# Patient Record
Sex: Female | Born: 1975 | Race: White | Hispanic: No | Marital: Single | Smoking: Current every day smoker
Health system: Southern US, Community
[De-identification: ages and names within clinical notes are randomized; demographics above are authoritative.]

## PROBLEM LIST (undated history)

## (undated) DIAGNOSIS — E785 Hyperlipidemia, unspecified: Secondary | ICD-10-CM

## (undated) DIAGNOSIS — J45909 Unspecified asthma, uncomplicated: Secondary | ICD-10-CM

## (undated) DIAGNOSIS — F410 Panic disorder [episodic paroxysmal anxiety] without agoraphobia: Secondary | ICD-10-CM

## (undated) DIAGNOSIS — F4310 Post-traumatic stress disorder, unspecified: Secondary | ICD-10-CM

## (undated) HISTORY — PX: TUBAL LIGATION: SHX77

## (undated) HISTORY — PX: KNEE SURGERY: SHX244

## (undated) HISTORY — PX: NECK SURGERY: SHX720

---

## 2008-01-16 DIAGNOSIS — F4310 Post-traumatic stress disorder, unspecified: Secondary | ICD-10-CM

## 2008-01-16 HISTORY — DX: Post-traumatic stress disorder, unspecified: F43.10

## 2013-01-15 DIAGNOSIS — E785 Hyperlipidemia, unspecified: Secondary | ICD-10-CM

## 2013-01-15 HISTORY — DX: Hyperlipidemia, unspecified: E78.5

## 2018-02-05 ENCOUNTER — Encounter (HOSPITAL_COMMUNITY): Payer: Self-pay | Admitting: *Deleted

## 2018-02-05 ENCOUNTER — Other Ambulatory Visit: Payer: Self-pay

## 2018-02-05 ENCOUNTER — Emergency Department (HOSPITAL_COMMUNITY): Payer: Self-pay

## 2018-02-05 ENCOUNTER — Emergency Department (HOSPITAL_COMMUNITY)
Admission: EM | Admit: 2018-02-05 | Discharge: 2018-02-06 | Disposition: A | Discharge status: Home / Self Care | Payer: Self-pay | Attending: Emergency Medicine | Admitting: Emergency Medicine

## 2018-02-05 DIAGNOSIS — F17210 Nicotine dependence, cigarettes, uncomplicated: Secondary | ICD-10-CM | POA: Insufficient documentation

## 2018-02-05 DIAGNOSIS — J4520 Mild intermittent asthma, uncomplicated: Secondary | ICD-10-CM | POA: Insufficient documentation

## 2018-02-05 DIAGNOSIS — R0789 Other chest pain: Secondary | ICD-10-CM

## 2018-02-05 HISTORY — DX: Unspecified asthma, uncomplicated: J45.909

## 2018-02-05 LAB — CBC
HCT: 39.8 % (ref 36.0–46.0)
Hemoglobin: 12.4 g/dL (ref 12.0–15.0)
MCH: 26.6 pg (ref 26.0–34.0)
MCHC: 31.2 g/dL (ref 30.0–36.0)
MCV: 85.2 fL (ref 80.0–100.0)
PLATELETS: 445 10*3/uL — AB (ref 150–400)
RBC: 4.67 MIL/uL (ref 3.87–5.11)
RDW: 13.9 % (ref 11.5–15.5)
WBC: 11.4 10*3/uL — ABNORMAL HIGH (ref 4.0–10.5)
nRBC: 0 % (ref 0.0–0.2)

## 2018-02-05 LAB — BASIC METABOLIC PANEL
Anion gap: 12 (ref 5–15)
BUN: 8 mg/dL (ref 6–20)
CALCIUM: 8.8 mg/dL — AB (ref 8.9–10.3)
CO2: 23 mmol/L (ref 22–32)
Chloride: 103 mmol/L (ref 98–111)
Creatinine, Ser: 0.84 mg/dL (ref 0.44–1.00)
GFR calc non Af Amer: >60 mL/min (ref >60)
Glucose, Bld: 101 mg/dL — ABNORMAL HIGH (ref 70–99)
Potassium: 3.7 mmol/L (ref 3.5–5.1)
Sodium: 138 mmol/L (ref 135–145)

## 2018-02-05 LAB — I-STAT BETA HCG BLOOD, ED (MC, WL, AP ONLY)

## 2018-02-05 LAB — TROPONIN I: Troponin I: <0.03 ng/mL (ref <0.03)

## 2018-02-05 MED ORDER — SODIUM CHLORIDE 0.9% FLUSH: 3.0000 mL | Freq: Once | INTRAVENOUS | Status: DC

## 2018-02-05 MED ORDER — ALBUTEROL SULFATE HFA 108 (90 BASE) MCG/ACT IN AERS
2.0000 | INHALATION_SPRAY | Freq: Once | RESPIRATORY_TRACT | Status: AC
  Administered 2018-02-05: 2 via RESPIRATORY_TRACT
  Filled 2018-02-05: qty 6.7

## 2018-02-05 MED ORDER — ALBUTEROL SULFATE (2.5 MG/3ML) 0.083% IN NEBU
5.0000 mg | INHALATION_SOLUTION | Freq: Once | RESPIRATORY_TRACT | Status: AC
  Administered 2018-02-05: 5 mg via RESPIRATORY_TRACT
  Filled 2018-02-05: qty 6

## 2018-02-05 NOTE — ED Notes (Signed)
Pt discharged from ED; instructions provided and scripts given; Pt encouraged to return to ED if symptoms worsen and to f/u with PCP; Pt verbalized understanding of all instructions 

## 2018-02-05 NOTE — ED Triage Notes (Signed)
Pt reports she was at rest and had onset of left sided chest "burning" that goes up into her throat. She has asthma, +cough, last used inhaler this afternoon. Feels nauseated.

## 2018-02-05 NOTE — ED Provider Notes (Signed)
[facility] EMERGENCY DEPARTMENT Provider Note  CSN: [id number] Arrival date & time: 02/05/18 1939  Chief Complaint(s) Chest Pain  HPI Tammy Zamora is a 43 y.o. female   The history is provided by the patient.  Chest Pain  Chest pain location: sternal. Pain quality: sharp and tightness   Pain radiates to:  Does not radiate Pain severity:  Moderate Onset quality:  Gradual Duration:  4 hours Timing:  Constant Progression:  Waxing and waning Chronicity:  Recurrent Context: movement   Context: not trauma   Context comment:  Asthma attack; similar to prior Relieved by: breathing treatments. Worsened by:  Coughing Associated symptoms: cough (dry), nausea and shortness of breath   Associated symptoms: no fever and no vomiting   Risk factors: smoking     Past Medical History Past Medical History:  Diagnosis Date  . Asthma    There are no active problems to display for this patient.  Home Medication(s) Prior to Admission medications   Not on File                                                                                                                                    Past Surgical History Past Surgical History:  Procedure Laterality Date  . NECK SURGERY     Family History No family history on file.  Social History Social History   Tobacco Use  . Smoking status: Current Every Day Smoker  . Smokeless tobacco: Never Used  Substance Use Topics  . Alcohol use: Never    Frequency: Never  . Drug use: Never   Allergies Morphine and related  Review of Systems Review of Systems  Constitutional: Negative for fever.  Respiratory: Positive for cough (dry) and shortness of breath.   Cardiovascular: Positive for chest pain.  Gastrointestinal: Positive for nausea. Negative for vomiting.   All other systems are reviewed and are negative for acute change except as noted in the HPI  Physical Exam Vital Signs  I have reviewed the triage  vital signs BP 136/80   Pulse 86   Temp (!) 97.4 F (36.3 C) (Oral)   Resp (!) 23   LMP 01/29/2018   SpO2 98%   Physical Exam Vitals signs reviewed.  Constitutional:      General: She is not in acute distress.    Appearance: She is well-developed. She is not diaphoretic.  HENT:     Head: Normocephalic and atraumatic.     Comments: edentulous    Nose: Nose normal.  Eyes:     General: No scleral icterus.       Right eye: No discharge.        Left eye: No discharge.     Conjunctiva/sclera: Conjunctivae normal.     Pupils: Pupils are equal, round, and reactive to light.  Neck:     Musculoskeletal: Normal range of motion and neck supple.  Cardiovascular:  Rate and Rhythm: Normal rate and regular rhythm.     Heart sounds: No murmur. No friction rub. No gallop.   Pulmonary:     Effort: Pulmonary effort is normal. No respiratory distress.     Breath sounds: Normal breath sounds. No stridor. No wheezing, rhonchi or rales.  Chest:    Abdominal:     General: There is no distension.     Palpations: Abdomen is soft.     Tenderness: There is no abdominal tenderness.  Musculoskeletal:        General: No tenderness.  Skin:    General: Skin is warm and dry.     Findings: No erythema or rash.  Neurological:     Mental Status: She is alert and oriented to person, place, and time.     ED Results and Treatments Labs (all labs ordered are listed, but only abnormal results are displayed) Labs Reviewed  BASIC METABOLIC PANEL - Abnormal; Notable for the following components:      Result Value   Glucose, Bld 101 (*)    Calcium 8.8 (*)    All other components within normal limits  CBC - Abnormal; Notable for the following components:   WBC 11.4 (*)    Platelets 445 (*)    All other components within normal limits  TROPONIN I  I-STAT BETA HCG BLOOD, ED ([facility], [facility], [facility] ONLY)                                                                                                                          EKG  EKG Interpretation  Date/Time:  Wednesday February 05 2018 19:47:24 EST Ventricular Rate:  78 PR Interval:  124 QRS Duration: 86 QT Interval:  390 QTC Calculation: 444 R Axis:   42 Text Interpretation:  Normal sinus rhythm Abnormal ECG NO STEMI No old tracing to compare Confirmed by Drema Pryardama, Ger Ringenberg [phone]([id number]) on 02/05/2018 11:32:36 PM      Radiology Dg Chest 2 View  Result Date: 02/05/2018 CLINICAL DATA:  Chest pain EXAM: CHEST - 2 VIEW COMPARISON:  None. FINDINGS: The heart size and mediastinal contours are within normal limits. Both lungs are clear. Surgical changes in the lower cervical spine. Mild degenerative changes of the spine. IMPRESSION: No active cardiopulmonary disease. Electronically Signed   By: Jasmine PangKim  Fujinaga M.D.   On: 02/05/2018 20:49   Pertinent labs & imaging results that were available during my care of the patient were reviewed by me and considered in my medical decision making (see chart for details).  Medications Ordered in ED Medications  sodium chloride flush (NS) 0.9 % injection 3 mL (has no administration in time range)  albuterol (PROVENTIL) (2.5 MG/3ML) 0.083% nebulizer solution 5 mg (5 mg Nebulization Given 02/05/18 2002)  albuterol (PROVENTIL HFA;VENTOLIN HFA) 108 (90 Base) MCG/ACT inhaler 2 puff (2 puffs Inhalation Given 02/05/18 2339)  Procedures Procedures  (including critical care time)  Medical Decision Making / ED Course I have reviewed the nursing notes for this encounter and the patient's prior records (if available in EHR or on provided paperwork).    Patient presents with sternal chest pain most consistent with chest wall pain.  Highly inconsistent with ACS.  EKG without acute ischemic changes or evidence of pericarditis.  Initial troponin negative.  Given the low suspicion for cardiac etiology, do not  feel that additional troponin is necessary at this time.  Low pretest probability for pulmonary embolism.  Presentation not classic for aortic dissection or esophageal perforation.  Chest x-ray without evidence suggestive of pneumonia, pneumothorax, pneumomediastinum.  No abnormal contour of the mediastinum to suggest dissection. No evidence of acute injuries.  The patient appears reasonably screened and/or stabilized for discharge and I doubt any other medical condition or other [facility] requiring further screening, evaluation, or treatment in the ED at this time prior to discharge.  The patient is safe for discharge with strict return precautions.   Final Clinical Impression(s) / ED Diagnoses Final diagnoses:  Chest wall pain  Mild intermittent asthma without complication   Disposition: Discharge  Condition: Good  I have discussed the results, Dx and Tx plan with the patient who expressed understanding and agree(s) with the plan. Discharge instructions discussed at great length. The patient was given strict return precautions who verbalized understanding of the instructions. No further questions at time of discharge.    ED Discharge Orders    None       Follow Up: Primary care provider  Schedule an appointment as soon as possible for a visit  If you do not have a primary care physician, contact [facility] at [phone] for referral      This chart was dictated using voice recognition software.  Despite best efforts to proofread,  errors can occur which can change the documentation meaning.   Nira Connardama, Coulter Oldaker Eduardo, MD 02/06/18 [phone]

## 2018-03-27 ENCOUNTER — Emergency Department (HOSPITAL_COMMUNITY)
Admission: EM | Admit: 2018-03-27 | Discharge: 2018-03-27 | Disposition: A | Discharge status: Home / Self Care | Payer: Self-pay | Attending: Emergency Medicine | Admitting: Emergency Medicine

## 2018-03-27 ENCOUNTER — Other Ambulatory Visit: Payer: Self-pay

## 2018-03-27 ENCOUNTER — Encounter (HOSPITAL_COMMUNITY): Payer: Self-pay | Admitting: Emergency Medicine

## 2018-03-27 DIAGNOSIS — S80251A Superficial foreign body, right knee, initial encounter: Secondary | ICD-10-CM | POA: Insufficient documentation

## 2018-03-27 DIAGNOSIS — J45909 Unspecified asthma, uncomplicated: Secondary | ICD-10-CM | POA: Insufficient documentation

## 2018-03-27 DIAGNOSIS — Y999 Unspecified external cause status: Secondary | ICD-10-CM | POA: Insufficient documentation

## 2018-03-27 DIAGNOSIS — Y929 Unspecified place or not applicable: Secondary | ICD-10-CM | POA: Insufficient documentation

## 2018-03-27 DIAGNOSIS — Y939 Activity, unspecified: Secondary | ICD-10-CM | POA: Insufficient documentation

## 2018-03-27 DIAGNOSIS — T148XXA Other injury of unspecified body region, initial encounter: Secondary | ICD-10-CM

## 2018-03-27 DIAGNOSIS — W57XXXA Bitten or stung by nonvenomous insect and other nonvenomous arthropods, initial encounter: Secondary | ICD-10-CM | POA: Insufficient documentation

## 2018-03-27 DIAGNOSIS — F17210 Nicotine dependence, cigarettes, uncomplicated: Secondary | ICD-10-CM | POA: Insufficient documentation

## 2018-03-27 HISTORY — DX: Post-traumatic stress disorder, unspecified: F43.10

## 2018-03-27 HISTORY — DX: Hyperlipidemia, unspecified: E78.5

## 2018-03-27 MED ORDER — SULFAMETHOXAZOLE-TRIMETHOPRIM 800-160 MG PO TABS: 1.0000 | ORAL_TABLET | Freq: Two times a day (BID) | ORAL | 0 refills | Status: AC

## 2018-03-27 MED ORDER — POVIDONE-IODINE 10 % EX SOLN
CUTANEOUS | Status: AC
  Administered 2018-03-27: 2
  Filled 2018-03-27: qty 30

## 2018-03-27 MED ORDER — LIDOCAINE-EPINEPHRINE (PF) 1 %-1:200000 IJ SOLN
10.0000 mL | Freq: Once | INTRAMUSCULAR | Status: AC
  Administered 2018-03-27: 10 mL
  Filled 2018-03-27: qty 30

## 2018-03-27 NOTE — ED Provider Notes (Addendum)
[facility] EMERGENCY DEPARTMENT Provider Note   CSN: [id number] Arrival date & time: 03/27/18  4696    History   Chief Complaint Chief Complaint  Patient presents with  . Insect Bite    HPI Tammy Zamora is a 43 y.o. female.     Patient is a 43 year old female who presents to the emergency department with complaint of tick bite  Patient states that she noted a tick embedded in the back of her right knee this morning.  She attempted to remove the tick with tweezers, but a portion of the tick remained embedded in the skin.  She and family member attempted to remove it with a knife but were unsuccessful.  They noticed increasing redness as the days progressed and thought they should come to the emergency department for evaluation.  There is been no fever or chills reported.  No nausea or vomiting noted.  No red streaks according to the patient.  The history is provided by the patient.    Past Medical History:  Diagnosis Date  . Asthma   . Hyperlipemia 2015  . PTSD (post-traumatic stress disorder) 2010    There are no active problems to display for this patient.   Past Surgical History:  Procedure Laterality Date  . NECK SURGERY       OB History   No obstetric history on file.      Home Medications    Prior to Admission medications   Not on File    Family History History reviewed. No pertinent family history.  Social History Social History   Tobacco Use  . Smoking status: Current Every Day Smoker  . Smokeless tobacco: Never Used  Substance Use Topics  . Alcohol use: Never    Frequency: Never  . Drug use: Never     Allergies   Morphine and related   Review of Systems Review of Systems  Constitutional: Negative for activity change.       All ROS Neg except as noted in HPI  HENT: Negative for nosebleeds.   Eyes: Negative for photophobia and discharge.  Respiratory: Negative for cough, shortness of breath and wheezing.   Cardiovascular:  Negative for chest pain and palpitations.  Gastrointestinal: Negative for abdominal pain and blood in stool.  Genitourinary: Negative for dysuria, frequency and hematuria.  Musculoskeletal: Negative for arthralgias, back pain and neck pain.  Skin: Negative.   Neurological: Negative for dizziness, seizures and speech difficulty.  Psychiatric/Behavioral: Negative for confusion and hallucinations.     Physical Exam Updated Vital Signs BP 129/65 (BP Location: Right Arm)   Pulse 65   Temp 98.1 F (36.7 C) (Oral)   Resp 16   Ht  (1.778 m)   Wt 90.7 kg   LMP 02/25/2018   SpO2 99%   BMI 28.70 kg/m   Physical Exam Vitals signs and nursing note reviewed.  Constitutional:      Appearance: She is well-developed. She is not toxic-appearing.  HENT:     Head: Normocephalic.     Right Ear: Tympanic membrane and external ear normal.     Left Ear: Tympanic membrane and external ear normal.  Eyes:     General: Lids are normal.     Pupils: Pupils are equal, round, and reactive to light.  Neck:     Musculoskeletal: Normal range of motion and neck supple.     Vascular: No carotid bruit.  Cardiovascular:     Rate and Rhythm: Normal rate and regular rhythm.  Pulses: Normal pulses.     Heart sounds: Normal heart sounds.  Pulmonary:     Effort: No respiratory distress.     Breath sounds: Normal breath sounds.  Abdominal:     General: Bowel sounds are normal.     Palpations: Abdomen is soft.     Tenderness: There is no abdominal tenderness. There is no guarding.  Musculoskeletal: Normal range of motion.        General: Tenderness present.     Comments: There is an area of increased redness behind the right knee.  There is a foreign body embedded in the skin.  It is believed that this may be a portion of the tick that the family attempted to remove earlier today.  The area is warm but not hot.  There are no red streaks appreciated.  There is full range of motion of the right hip,  knee, and ankle.  Lymphadenopathy:     Head:     Right side of head: No submandibular adenopathy.     Left side of head: No submandibular adenopathy.     Cervical: No cervical adenopathy.  Skin:    General: Skin is warm and dry.  Neurological:     Mental Status: She is alert and oriented to person, place, and time.     Cranial Nerves: No cranial nerve deficit.     Sensory: No sensory deficit.  Psychiatric:        Speech: Speech normal.      ED Treatments / Results  Labs (all labs ordered are listed, but only abnormal results are displayed) Labs Reviewed - No data to display  EKG None  Radiology No results found.  Procedures .Foreign Body Removal Date/Time: 03/27/2018 12:00 PM Performed by: Ivery Quale, PA-C Authorized by: Ivery Quale, PA-C  Consent: Verbal consent obtained. Risks and benefits: risks, benefits and alternatives were discussed Consent given by: patient Patient understanding: patient states understanding of the procedure being performed Patient identity confirmed: arm band Time out: Immediately prior to procedure a "time out" was called to verify the correct patient, procedure, equipment, support staff and site/side marked as required. Body area: skin General location: lower extremity Location details: right knee Anesthesia: local infiltration  Anesthesia: Local Anesthetic: lidocaine 1% with epinephrine Anesthetic total: 3 mL  Sedation: Patient sedated: no  Localization method: visualized Removal mechanism: forceps and scalpel Dressing: antibiotic ointment and dressing applied Tendon involvement: none Depth: subcutaneous Complexity: simple 1 objects recovered. Objects recovered: tick body part Post-procedure assessment: foreign body removed Patient tolerance: Patient tolerated the procedure well with no immediate complications   (including critical care time)  Medications Ordered in ED Medications  lidocaine-EPINEPHrine  (XYLOCAINE-EPINEPHrine) 1 %-1:200000 (PF) injection 10 mL (has no administration in time range)  povidone-iodine (BETADINE) 10 % external solution (has no administration in time range)     Initial Impression / Assessment and Plan / ED Course  I have reviewed the triage vital signs and the nursing notes.  Pertinent labs & imaging results that were available during my care of the patient were reviewed by me and considered in my medical decision making (see chart for details).          Final Clinical Impressions(s) / ED Diagnoses MDM  Patient had a take the right posterior knee.  She attempted to remove it, but portion of the tick could not be removed.  She presents now for assistance with this issue.  The foreign body was removed without problem.  Patient is  placed on Bactrim twice daily.  Patient is to cleanse wound daily with soap and water and apply fresh dressing.  I gave the patient strict instructions to return if any signs of infection.  The patient acknowledges understanding of the instructions and is in agreement.   Final diagnoses:  Tick bite, initial encounter  Foreign body in skin    ED Discharge Orders    None       Ivery Quale, Cordelia Poche 03/27/18 1206    Raeford Razor, MD 03/28/18 0818    Ivery Quale, PA-C 04/09/18 2223    Raeford Razor, MD 04/09/18 2317

## 2018-03-27 NOTE — ED Triage Notes (Signed)
Pt reports she was bit by a tick this morning, states she tried to remove the tick with tweezers but broke part of the tick off, pt reports she attempted to remove the tick with a knife with no success, minimal redness noted to posterior right knee

## 2018-03-27 NOTE — Discharge Instructions (Addendum)
Please cleanse the wound daily with soap and water.  Please apply dressing until the wound heals from the inside out.  Please see your primary physician or return to the emergency department if any red streaks going up the leg, any pus like material from the wound, or any fever that would not respond to Tylenol or ibuprofen.  Use Bactrim 2 times daily with food until all taken.

## 2018-09-19 ENCOUNTER — Emergency Department (HOSPITAL_COMMUNITY)
Admission: EM | Admit: 2018-09-19 | Discharge: 2018-09-19 | Disposition: A | Discharge status: Home / Self Care | Payer: Self-pay | Attending: Emergency Medicine | Admitting: Emergency Medicine

## 2018-09-19 ENCOUNTER — Other Ambulatory Visit: Payer: Self-pay

## 2018-09-19 ENCOUNTER — Emergency Department (HOSPITAL_COMMUNITY): Payer: Self-pay

## 2018-09-19 ENCOUNTER — Encounter (HOSPITAL_COMMUNITY): Payer: Self-pay

## 2018-09-19 DIAGNOSIS — Y9301 Activity, walking, marching and hiking: Secondary | ICD-10-CM | POA: Insufficient documentation

## 2018-09-19 DIAGNOSIS — F17210 Nicotine dependence, cigarettes, uncomplicated: Secondary | ICD-10-CM | POA: Insufficient documentation

## 2018-09-19 DIAGNOSIS — J45909 Unspecified asthma, uncomplicated: Secondary | ICD-10-CM | POA: Insufficient documentation

## 2018-09-19 DIAGNOSIS — X509XXA Other and unspecified overexertion or strenuous movements or postures, initial encounter: Secondary | ICD-10-CM | POA: Insufficient documentation

## 2018-09-19 DIAGNOSIS — Y929 Unspecified place or not applicable: Secondary | ICD-10-CM | POA: Insufficient documentation

## 2018-09-19 DIAGNOSIS — Y999 Unspecified external cause status: Secondary | ICD-10-CM | POA: Insufficient documentation

## 2018-09-19 DIAGNOSIS — E785 Hyperlipidemia, unspecified: Secondary | ICD-10-CM | POA: Insufficient documentation

## 2018-09-19 DIAGNOSIS — S8392XA Sprain of unspecified site of left knee, initial encounter: Secondary | ICD-10-CM | POA: Insufficient documentation

## 2018-09-19 MED ORDER — MELOXICAM 7.5 MG PO TABS: 7.5000 mg | ORAL_TABLET | Freq: Every day | ORAL | 0 refills | Status: DC

## 2018-09-19 MED ORDER — HYDROCODONE-ACETAMINOPHEN 5-325 MG PO TABS
2.0000 | ORAL_TABLET | Freq: Once | ORAL | Status: AC
  Administered 2018-09-19: 14:00:00 2 via ORAL
  Filled 2018-09-19: qty 2

## 2018-09-19 NOTE — ED Triage Notes (Signed)
Pt presents to ED with complaints of left knee pain. Pt states it started 4 days ago, left knee buckled.

## 2018-09-19 NOTE — ED Provider Notes (Signed)
[facility]   CSN: [id number] Arrival date & time: 09/19/18  1219     History   Chief Complaint Chief Complaint  Patient presents with  . Knee Pain    HPI Maurine SimmeringLashonta Zamora is a 43 y.o. female with PMH/o Asthma, HLD who presents for evaluation of left knee pain x4 days ago.  She states that she does not recall any specific injury, trauma but states that since then, she has had pain mostly to the sides and posterior aspect of the leg as well as feeling like it buckles.  She states that the buckling will occur randomly while she was walking.  She is able to ambulate and bear weight though with pain.  Today, she was at the grocery store and felt like her leg buckled worse than normal.  She states that she has been taking Tylenol and ibuprofen with no improvement in pain.  She has not had any numbness/weakness, fever.      The history is provided by the patient.    Past Medical History:  Diagnosis Date  . Asthma   . Hyperlipemia 2015  . PTSD (post-traumatic stress disorder) 2010    There are no active problems to display for this patient.   Past Surgical History:  Procedure Laterality Date  . KNEE SURGERY    . NECK SURGERY    . TUBAL LIGATION       OB History   No obstetric history on file.      Home Medications    Prior to Admission medications   Medication Sig Start Date End Date Taking? Authorizing Provider  meloxicam (MOBIC) 7.5 MG tablet Take 1 tablet (7.5 mg total) by mouth daily. 09/19/18   Maxwell CaulLayden, Hulda Reddix A, PA-C    Family History No family history on file.  Social History Social History   Tobacco Use  . Smoking status: Current Every Day Smoker  . Smokeless tobacco: Never Used  Substance Use Topics  . Alcohol use: Never    Frequency: Never  . Drug use: Never     Allergies   Morphine and related   Review of Systems Review of Systems  Constitutional: Negative for fever.  Musculoskeletal:       Knee pain   Neurological: Negative for weakness and numbness.  All other systems reviewed and are negative.    Physical Exam Updated Vital Signs BP 116/62 (BP Location: Right Arm)   Pulse 66   Temp 98.1 F (36.7 C) (Oral)   Resp 15   Ht 5\' 10"  (1.778 m)   Wt 90.7 kg   LMP 08/25/2018   SpO2 98%   BMI 28.70 kg/m   Physical Exam Vitals signs and nursing note reviewed.  Constitutional:      Appearance: She is well-developed.  HENT:     Head: Normocephalic and atraumatic.  Eyes:     General: No scleral icterus.       Right eye: No discharge.        Left eye: No discharge.     Conjunctiva/sclera: Conjunctivae normal.  Cardiovascular:     Pulses:          Dorsalis pedis pulses are 2+ on the right side and 2+ on the left side.  Pulmonary:     Effort: Pulmonary effort is normal.  Musculoskeletal:     Comments: Tenderness palpation noted to posterior, medial and lateral aspect of left knee.  No overlying warmth, erythema, edema.  No deformity or crepitus noted.  No calf tenderness palpation.  Negative posterior and anterior drawer test.  She does have some mild pain instability noted on valgus stress.  No instability noted on varus stress.  Skin:    General: Skin is warm and dry.  Neurological:     Mental Status: She is alert.     Comments: Follows commands, Moves all extremities  5/5 strength to BUE and BLE  Sensation intact throughout all major nerve distributions  Psychiatric:        Speech: Speech normal.        Behavior: Behavior normal.      ED Treatments / Results  Labs (all labs ordered are listed, but only abnormal results are displayed) Labs Reviewed - No data to display  EKG None  Radiology Dg Knee Complete 4 Views Left  Result Date: 09/19/2018 CLINICAL DATA:  Fall EXAM: LEFT KNEE - COMPLETE 4+ VIEW COMPARISON:  None FINDINGS: Osseous mineralization normal. Mild medial compartment and patellofemoral joint space narrowing. No acute fracture, dislocation, or bone  destruction. No joint effusion. IMPRESSION: Mild degenerative changes LEFT knee without acute bony abnormalities. Electronically Signed   By: Lavonia Dana M.D.   On: 09/19/2018 13:08    Procedures Procedures (including critical care time)  Medications Ordered in ED Medications  HYDROcodone-acetaminophen (NORCO/VICODIN) 5-325 MG per tablet 2 tablet (2 tablets Oral Given 09/19/18 1355)     Initial Impression / Assessment and Plan / ED Course  I have reviewed the triage vital signs and the nursing notes.  Pertinent labs & imaging results that were available during my care of the patient were reviewed by me and considered in my medical decision making (see chart for details).        43 year old female who presents for evaluation of left knee pain x4 days.  Does not recall specific trauma injury but since then has had buckling in her knee.  Worse today when she was at the grocery store.  No numbness/weakness, warmth, erythema, fevers.  Patient is afebrile, non-toxic appearing, sitting comfortably on examination table. Vital signs reviewed and stable. Patient is neurovascularly intact.on exam, she has tenderness palpation noted to the posterior and medial lateral side of the left knee.  No deformity or crepitus noted.  No warmth, erythema.  She does have some instability noticed on valgus stress.  Concern for knee sprain versus strain.  History/physical exam not concerning for septic nephritis, acute arterial embolism/ischemic limb, DVT.  X-rays ordered at triage.  X-rays reviewed.  There is some mild medial compartment joint space narrowing with degenerative changes.  No acute bony abnormality.  Discussed results with patient.  Given concerns for knee sprain, will put her in a knee immobilizer and crutches.  Patient given a dose of pain medication here in the ED.  She does have allergy listed to morphine but states she has tolerated hydrocodone oxycodone in the past without any difficulty.  We will  plan to send her home on a course of Mobic.  Patient instructed to follow-up with orthopedics. At this time, patient exhibits no emergent life-threatening condition that require further evaluation in ED or admission. Patient had ample opportunity for questions and discussion. All patient's questions were answered with full understanding. Strict return precautions discussed. Patient expresses understanding and agreement to plan.   Portions of this note were generated with [employer]. Dictation errors may occur despite best attempts at proofreading.   Final Clinical Impressions(s) / ED Diagnoses   Final diagnoses:  Sprain of left knee, unspecified  ligament, initial encounter    ED Discharge Orders         Ordered    meloxicam (MOBIC) 7.5 MG tablet  Daily     09/19/18 1341           [name], PA-C 09/19/18 2050    Samuel Jester, DO 09/22/18 1717

## 2018-09-19 NOTE — Discharge Instructions (Addendum)
Take Mobic as directed.  At home, elevate and ice the knee.  Wear knee immobilizer and use crutches as directed.  Follow-up with referred orthopedic doctor for further evaluation.  Return emergency department for any fevers, redness or swelling around the leg, difficulty breathing, chest pain or any other worsening or concerning symptoms.

## 2018-09-26 ENCOUNTER — Emergency Department (HOSPITAL_COMMUNITY): Payer: Medicaid - Out of State

## 2018-09-26 ENCOUNTER — Emergency Department (HOSPITAL_COMMUNITY)
Admission: EM | Admit: 2018-09-26 | Discharge: 2018-09-26 | Disposition: A | Discharge status: Home / Self Care | Payer: Medicaid - Out of State | Attending: Emergency Medicine | Admitting: Emergency Medicine

## 2018-09-26 ENCOUNTER — Other Ambulatory Visit: Payer: Self-pay

## 2018-09-26 ENCOUNTER — Encounter (HOSPITAL_COMMUNITY): Payer: Self-pay | Admitting: Emergency Medicine

## 2018-09-26 DIAGNOSIS — M25562 Pain in left knee: Secondary | ICD-10-CM

## 2018-09-26 DIAGNOSIS — J45909 Unspecified asthma, uncomplicated: Secondary | ICD-10-CM | POA: Insufficient documentation

## 2018-09-26 DIAGNOSIS — F17200 Nicotine dependence, unspecified, uncomplicated: Secondary | ICD-10-CM | POA: Insufficient documentation

## 2018-09-26 DIAGNOSIS — Y9389 Activity, other specified: Secondary | ICD-10-CM | POA: Insufficient documentation

## 2018-09-26 DIAGNOSIS — W010XXA Fall on same level from slipping, tripping and stumbling without subsequent striking against object, initial encounter: Secondary | ICD-10-CM | POA: Diagnosis not present

## 2018-09-26 NOTE — Discharge Instructions (Signed)
The xray of your knee was negative for any breaks today Please follow up with orthopedist Dr. Aline Brochure regarding your knee pain Continue taking Mobic as prescribed Rest, ice, and elevate knee for comfort

## 2018-09-26 NOTE — ED Triage Notes (Signed)
Patient reports she fell yesterday trying to get in to her truck. C/O L knee pain.

## 2018-09-26 NOTE — ED Provider Notes (Signed)
[facility] EMERGENCY DEPARTMENT Provider Note   CSN: [id number] Arrival date & time: 09/26/18  1028     History   Chief Complaint Chief Complaint  Patient presents with  . Knee Pain    HPI Tammy Zamora is a 43 y.o. female who presents to the ED today complaining of an onset, constant, worsening, left knee pain status post mechanical fall that occurred yesterday.  Patient was seen in the ED on 0904 for left knee pain without a specific injury.  She had an x-ray done at that time with no acute findings.  There was concern for knee sprain at that time.  Patient was given a knee brace and crutches and advised to follow-up with orthopedics.  Was also given prescription for Mobic at that time.  She states that she was taking the medications with mild relief.  She had not followed up with orthopedics given monetary issues/lack of insurance.  States that while going to work yesterday she tripped and fell landing directly on the knee, causing worsening pain.  States that she was up all night due to the pain.  She has not been taking anything else besides the Mobic for pain.  Denies fever, chills, redness, wound, paresthesias, weakness, numbness, any other associated symptoms.      The history is provided by the patient and medical records.      Past Medical History:  Diagnosis Date  . Asthma   . Hyperlipemia 2015  . PTSD (post-traumatic stress disorder) 2010    There are no active problems to display for this patient.   Past Surgical History:  Procedure Laterality Date  . KNEE SURGERY    . NECK SURGERY    . TUBAL LIGATION       OB History    Gravida  3   Para  3   Term  3   Preterm      AB      Living        SAB      TAB      Ectopic      Multiple      Live Births               Home Medications    Prior to Admission medications   Medication Sig Start Date End Date Taking? Authorizing Provider  meloxicam (MOBIC) 7.5 MG tablet Take 1 tablet (7.5 mg  total) by mouth daily. 09/19/18   [name], [name]    Family History Family History  Problem Relation Age of Onset  . Cancer Other   . Stroke Other   . Heart attack Other     Social History Social History   Tobacco Use  . Smoking status: Current Every Day Smoker  . Smokeless tobacco: Never Used  Substance Use Topics  . Alcohol use: Never    Frequency: Never  . Drug use: Never     Allergies   Morphine and related   Review of Systems Review of Systems  Constitutional: Negative for chills and fever.  Musculoskeletal: Positive for arthralgias.  Skin: Negative for color change and wound.  Neurological: Negative for weakness and numbness.     Physical Exam Updated Vital Signs BP (!) 128/54 (BP Location: Right Arm)   Pulse 85   Temp 99.7 F (37.6 C) (Oral)   Resp 16   LMP 08/23/2018   SpO2 97%   Physical Exam Vitals signs and nursing note reviewed.  Constitutional:  Appearance: She is not ill-appearing.  HENT:     Head: Normocephalic and atraumatic.  Eyes:     Conjunctiva/sclera: Conjunctivae normal.  Neck:     Musculoskeletal: Neck supple.  Cardiovascular:     Rate and Rhythm: Normal rate and regular rhythm.  Pulmonary:     Effort: Pulmonary effort is normal.     Breath sounds: Normal breath sounds.  Abdominal:     Palpations: Abdomen is soft.     Tenderness: There is no abdominal tenderness.  Musculoskeletal:     Comments: No overlying warmth, erythema, edema. No obvious ecchymosis. Tenderness to palpation noted to posterior, medial and lateral aspect of left knee. No deformity or crepitus noted.  No calf tenderness palpation.  Negative posterior and anterior drawer test.  Mild valgus joint laxity. No varus laxity.   Skin:    General: Skin is warm and dry.  Neurological:     Mental Status: She is alert.      ED Treatments / Results  Labs (all labs ordered are listed, but only abnormal results are displayed) Labs Reviewed - No data to  display  EKG None  Radiology Dg Knee Complete 4 Views Left  Result Date: 09/26/2018 CLINICAL DATA:  Larey SeatFell yesterday trying to get into or truck, LEFT knee pain post fall EXAM: LEFT KNEE - COMPLETE 4+ VIEW COMPARISON:  09/19/2018 FINDINGS: Osseous mineralization normal. Minimal joint space narrowing and spur formation at medial compartment. No fracture, dislocation, or bone destruction. No joint effusion. IMPRESSION: Mild degenerative changes of the LEFT knee without acute bony abnormalities. Electronically Signed   By: Ulyses SouthwardMark  Boles M.D.   On: 09/26/2018 11:39    Procedures Procedures (including critical care time)  Medications Ordered in ED Medications - No data to display   Initial Impression / Assessment and Plan / ED Course  I have reviewed the triage vital signs and the nursing notes.  Pertinent labs & imaging results that were available during my care of the patient were reviewed by me and considered in my medical decision making (see chart for details).    43 year old female who presents today with worsening left knee pain after falling directly on it yesterday.  She was seen in the ED on 09/04 for atraumatic left knee pain.  Had an x-ray done at that time which did show some mild generative changes but no acute fractures.  Patient was placed in a knee brace given crutches and told to follow-up with orthopedics which she did not do.  She reports that the pain has been worse since falling on it.  Will obtain repeat x-ray today although if patient does have soft tissue injury versus ligament injury she will need to follow-up with orthopedics.  She states that the registration person told her that she does actually have Medicaid but she was not aware of.  She is in agreement to follow-up with orthopedics after this ED visit.   Right negative for acute changes today.  Patient will need to follow-up with orthopedist.  Have advised continue taking Mobic as needed for pain.  Rice therapy  discussed.  Patient has crutches with her at bedside. She has been given a work note to reduce being on her knee all day until she can follow up with orthopedist. Strict return precautions discussed. Pt is in agreement with plan at this time and stable for discharge home.   This note was prepared using Dragon voice recognition software and may include unintentional dictation errors due to the inherent  limitations of voice recognition software.       Final Clinical Impressions(s) / ED Diagnoses   Final diagnoses:  Acute pain of left knee    ED Discharge Orders    None       Eustaquio Maize, [name] 09/26/18 [place], [place], DO 09/28/18 [phone]

## 2018-11-27 ENCOUNTER — Emergency Department (HOSPITAL_COMMUNITY)
Admission: EM | Admit: 2018-11-27 | Discharge: 2018-11-27 | Disposition: A | Discharge status: Home / Self Care | Payer: Medicaid - Out of State | Attending: Emergency Medicine | Admitting: Emergency Medicine

## 2018-11-27 ENCOUNTER — Other Ambulatory Visit: Payer: Self-pay

## 2018-11-27 ENCOUNTER — Encounter (HOSPITAL_COMMUNITY): Payer: Self-pay | Admitting: Emergency Medicine

## 2018-11-27 DIAGNOSIS — Z5321 Procedure and treatment not carried out due to patient leaving prior to being seen by health care provider: Secondary | ICD-10-CM | POA: Diagnosis not present

## 2018-11-27 DIAGNOSIS — R1110 Vomiting, unspecified: Secondary | ICD-10-CM | POA: Diagnosis present

## 2018-11-27 NOTE — ED Triage Notes (Addendum)
Pt c/o of cough, generalized body aches, fever and n/v/d x 2 days.  Two people she just visited were positive for [place]. States " how long do I have to wait because I just wanted to be tested?"

## 2018-11-28 ENCOUNTER — Other Ambulatory Visit: Payer: Self-pay

## 2018-11-28 DIAGNOSIS — Z20822 Contact with and (suspected) exposure to covid-19: Secondary | ICD-10-CM

## 2018-12-01 ENCOUNTER — Telehealth: Payer: Self-pay | Admitting: *Deleted

## 2018-12-01 LAB — NOVEL CORONAVIRUS, NAA: SARS-CoV-2, NAA: NOT DETECTED

## 2018-12-01 NOTE — Telephone Encounter (Signed)
Called in and requested COVID-19 test result.   I let her know it was not detected/negative meaning she did not have the virus.  I e mailed her a link to set up her MyChart account.

## 2019-01-01 ENCOUNTER — Emergency Department: Payer: PRIVATE HEALTH INSURANCE

## 2019-01-01 ENCOUNTER — Other Ambulatory Visit: Payer: Self-pay

## 2019-01-01 ENCOUNTER — Encounter: Payer: Self-pay | Admitting: Emergency Medicine

## 2019-01-01 ENCOUNTER — Emergency Department
Admission: EM | Admit: 2019-01-01 | Discharge: 2019-01-01 | Disposition: A | Discharge status: Home / Self Care | Payer: PRIVATE HEALTH INSURANCE | Attending: Student | Admitting: Student

## 2019-01-01 DIAGNOSIS — Y9389 Activity, other specified: Secondary | ICD-10-CM | POA: Diagnosis not present

## 2019-01-01 DIAGNOSIS — J45909 Unspecified asthma, uncomplicated: Secondary | ICD-10-CM | POA: Insufficient documentation

## 2019-01-01 DIAGNOSIS — Y990 Civilian activity done for income or pay: Secondary | ICD-10-CM | POA: Insufficient documentation

## 2019-01-01 DIAGNOSIS — S3992XA Unspecified injury of lower back, initial encounter: Secondary | ICD-10-CM | POA: Diagnosis present

## 2019-01-01 DIAGNOSIS — W010XXA Fall on same level from slipping, tripping and stumbling without subsequent striking against object, initial encounter: Secondary | ICD-10-CM | POA: Diagnosis not present

## 2019-01-01 DIAGNOSIS — F17200 Nicotine dependence, unspecified, uncomplicated: Secondary | ICD-10-CM | POA: Diagnosis not present

## 2019-01-01 DIAGNOSIS — S20229A Contusion of unspecified back wall of thorax, initial encounter: Secondary | ICD-10-CM | POA: Diagnosis not present

## 2019-01-01 DIAGNOSIS — W19XXXA Unspecified fall, initial encounter: Secondary | ICD-10-CM

## 2019-01-01 DIAGNOSIS — Y92513 Shop (commercial) as the place of occurrence of the external cause: Secondary | ICD-10-CM | POA: Insufficient documentation

## 2019-01-01 DIAGNOSIS — S93401A Sprain of unspecified ligament of right ankle, initial encounter: Secondary | ICD-10-CM

## 2019-01-01 MED ORDER — METHOCARBAMOL 500 MG PO TABS: 500.0000 mg | ORAL_TABLET | Freq: Four times a day (QID) | ORAL | 0 refills | Status: AC

## 2019-01-01 MED ORDER — MELOXICAM 15 MG PO TABS: 15.0000 mg | ORAL_TABLET | Freq: Every day | ORAL | 0 refills | Status: DC

## 2019-01-01 MED ORDER — OXYCODONE-ACETAMINOPHEN 5-325 MG PO TABS
1.0000 | ORAL_TABLET | Freq: Once | ORAL | Status: AC
  Administered 2019-01-01: 1 via ORAL
  Filled 2019-01-01: qty 1

## 2019-01-01 MED ORDER — HYDROCODONE-ACETAMINOPHEN 5-325 MG PO TABS: 1.0000 | ORAL_TABLET | ORAL | 0 refills | Status: AC | PRN

## 2019-01-01 MED ORDER — MELOXICAM 7.5 MG PO TABS
15.0000 mg | ORAL_TABLET | Freq: Once | ORAL | Status: AC
  Administered 2019-01-01: 15 mg via ORAL
  Filled 2019-01-01: qty 2

## 2019-01-01 MED ORDER — METHOCARBAMOL 500 MG PO TABS
1000.0000 mg | ORAL_TABLET | Freq: Once | ORAL | Status: AC
  Administered 2019-01-01: 1000 mg via ORAL
  Filled 2019-01-01: qty 2

## 2019-01-01 NOTE — ED Triage Notes (Signed)
Patient presents to the ED via [facility] from work at [employer] on [address].  Patient states she tripped over a palette and fell on her right side.  Patient is complaining of right hip/lower back pain and right ankle/foot pain.  Patient denies hitting her head or losing consciousness.

## 2019-01-01 NOTE — ED Notes (Signed)
This RN spoke to [employer], [place] Suell's supervisor and she states we do not need to perform a urine drug screen at this time.

## 2019-01-01 NOTE — ED Notes (Signed)
WC profile states: UDS only upon request.  This RN called

## 2019-01-01 NOTE — ED Provider Notes (Signed)
[facility] Emergency Department Provider Note  ____________________________________________  Time seen: Approximately 6:42 PM  I have reviewed the triage vital signs and the nursing notes.   HISTORY  Chief Complaint Fall (work injury)    HPI Tammy Zamora is a 43 y.o. female who presents the emergency department complaining of mid to lower back pain, right ankle pain after a fall at work.  Patient works at [employer], was attempting to move a car to help a patient when she apparently tripped over a wooden pallet, falling and landing on her back.  Patient is complaining mostly of back pain at this time.  She does have chronic radiculopathy but states that there is no change from baseline.  Patient is having sharp pain in the lower thoracic upper lumbar region.  She did not hit her head or pass out.  She denies any hip pain.  No bowel or bladder function, saddle anesthesia or paresthesias at this time.  Is unable to bear weight on the right ankle at this time.  No medications prior to arrival.  Patient does have a history of cervical fusion.          Past Medical History:  Diagnosis Date  . Asthma   . Hyperlipemia 2015  . PTSD (post-traumatic stress disorder) 2010    There are no problems to display for this patient.   Past Surgical History:  Procedure Laterality Date  . KNEE SURGERY    . NECK SURGERY    . TUBAL LIGATION      Prior to Admission medications   Medication Sig Start Date End Date Taking? Authorizing Provider  HYDROcodone-acetaminophen (NORCO/VICODIN) 5-325 MG tablet Take 1 tablet by mouth every 4 (four) hours as needed for moderate pain. 01/01/19   Minsa Weddington, Charline Bills, PA-C  meloxicam (MOBIC) 15 MG tablet Take 1 tablet (15 mg total) by mouth daily. 01/01/19   Doye Montilla, Charline Bills, PA-C  methocarbamol (ROBAXIN) 500 MG tablet Take 1 tablet (500 mg total) by mouth 4 (four) times daily. 01/01/19   Dezmond Downie, Charline Bills, PA-C     Allergies Morphine and related  Family History  Problem Relation Age of Onset  . Cancer Other   . Stroke Other   . Heart attack Other     Social History Social History   Tobacco Use  . Smoking status: Current Every Day Smoker  . Smokeless tobacco: Never Used  Substance Use Topics  . Alcohol use: Never  . Drug use: Never     Review of Systems  Constitutional: No fever/chills Eyes: No visual changes. No discharge ENT: No upper respiratory complaints. Cardiovascular: no chest pain. Respiratory: no cough. No SOB. Gastrointestinal: No abdominal pain.  No nausea, no vomiting.  Musculoskeletal: Positive for back pain and right ankle pain after fall Skin: Negative for rash, abrasions, lacerations, ecchymosis. Neurological: Negative for headaches, focal weakness or numbness. 10-point ROS otherwise negative.  ____________________________________________   PHYSICAL EXAM:  VITAL SIGNS: ED Triage Vitals  Enc Vitals Group     BP 01/01/19 1719 (!) 145/65     Pulse Rate 01/01/19 1719 88     Resp 01/01/19 1719 16     Temp 01/01/19 1719 99.3 F (37.4 C)     Temp Source 01/01/19 1719 Oral     SpO2 01/01/19 1719 99 %     Weight 01/01/19 1720 240 lb (108.9 kg)     Height 01/01/19 1720 5\' 10"  (1.778 m)     Head Circumference --  Peak Flow --      Pain Score 01/01/19 1720 8     Pain Loc --      Pain Edu? --      Excl. in GC? --      Constitutional: Alert and oriented. Well appearing and in no acute distress. Eyes: Conjunctivae are normal. PERRL. EOMI. Head: Atraumatic. ENT:      Ears:       Nose: No congestion/rhinnorhea.      Mouth/Throat: Mucous membranes are moist.  Neck: No stridor.  No cervical spine tenderness to palpation  Cardiovascular: Normal rate, regular rhythm. Normal S1 and S2.  Good peripheral circulation. Respiratory: Normal respiratory effort without tachypnea or retractions. Lungs CTAB. Good air entry to the bases with no decreased or absent  breath sounds. Musculoskeletal: Full range of motion to all extremities. No gross deformities appreciated.  Visualization of the spine reveals no visible signs of trauma.  No edema, ecchymosis, abrasions or lacerations.  Patient is tender to palpation over T10-L2.  Diffuse tenderness is also appreciated from mid thoracic into the lumbar spine as well.  Point specific tenderness though is over the lower thoracic and upper lumbar spine.  No tenderness to palpation of bilateral sciatic notches.  Dorsalis pedis pulse intact bilateral lower extremities.  Examination of the right ankle reveals mild edema when compared with left.  Diffuse tenderness to palpation throughout the ankle joint.  No palpable abnormality or deficit.  Dorsalis pedis pulse intact.  Sensation intact all digits. Neurologic:  Normal speech and language. No gross focal neurologic deficits are appreciated.  Skin:  Skin is warm, dry and intact. No rash noted. Psychiatric: Mood and affect are normal. Speech and behavior are normal. Patient exhibits appropriate insight and judgement.   ____________________________________________   LABS (all labs ordered are listed, but only abnormal results are displayed)  Labs Reviewed - No data to display ____________________________________________  EKG   ____________________________________________  RADIOLOGY I personally viewed and evaluated these images as part of my medical decision making, as well as reviewing the written report by the radiologist.  DG Thoracic Spine 2 View  Result Date: 01/01/2019 CLINICAL DATA:  Patient presents to the ED via [facility] from work at [employer] on [address]. Patient states she tripped over a palette and fell on her right side. Patient is complaining of right hip/lower back pain and right ankle/foot pain. Patient denies hitting her head or losing consciousness. EXAM: THORACIC SPINE 2 VIEWS COMPARISON:  None. FINDINGS: No fracture, bone lesion or  spondylolisthesis. Status post anterior cervical spine fusion C4 through C7. The orthopedic hardware appears well seated. Minor disc degenerative changes along mid to lower thoracic spine. Soft tissues are unremarkable. IMPRESSION: No fracture or acute finding. Electronically Signed   By: Amie Portland M.D.   On: 01/01/2019 19:19   DG Lumbar Spine 2-3 Views  Result Date: 01/01/2019 CLINICAL DATA:  Patient presents to the ED via [facility] from work at [employer] on [address]. Patient states she tripped over a palette and fell on her right side. Patient is complaining of right hip/lower back pain and right ankle/foot pain. Patient denies hitting her head or losing consciousness. EXAM: LUMBAR SPINE - 2-3 VIEW COMPARISON:  None. FINDINGS: No fracture, bone lesion or spondylolisthesis. Moderate loss of disc height at L4-L5 with mild loss of disc height at L5-S1. Remaining lumbar discs are well preserved in height. Soft tissues are unremarkable. IMPRESSION: 1. No fracture or acute finding. 2. Disc degenerative changes at  L4-L5 and L5-S1. Electronically Signed   By: Amie Portlandavid  Ormond M.D.   On: 01/01/2019 19:20   DG Ankle Complete Right  Result Date: 01/01/2019 CLINICAL DATA:  Fall, pain EXAM: RIGHT ANKLE - COMPLETE 3+ VIEW COMPARISON:  None. FINDINGS: There is circumferential soft tissue swelling of the ankle with trace effusion. Additional focal swelling along the dorsum of the foot. No acute bony abnormality. Specifically, no fracture, subluxation, or dislocation. Linear corticated os peroneum is noted. Mild degenerative changes of the ankle are present. Minimal spurring in the hindfoot. Plantar calcaneal spur is noted. IMPRESSION: 1. Soft tissue swelling of the ankle and dorsum of the foot. Trace ankle effusion. No acute osseous abnormality. 2. Plantar calcaneal spur. Electronically Signed   By: Kreg ShropshirePrice  DeHay M.D.   On: 01/01/2019 17:51     ____________________________________________    PROCEDURES  Procedure(s) performed:    Procedures    Medications  meloxicam (MOBIC) tablet 15 mg (has no administration in time range)  methocarbamol (ROBAXIN) tablet 1,000 mg (has no administration in time range)  oxyCODONE-acetaminophen (PERCOCET/ROXICET) 5-325 MG per tablet 1 tablet (1 tablet Oral Given 01/01/19 1855)     ____________________________________________   INITIAL IMPRESSION / ASSESSMENT AND PLAN / ED COURSE  Pertinent labs & imaging results that were available during my care of the patient were reviewed by me and considered in my medical decision making (see chart for details).  Review of the [place] CSRS was performed in accordance of the [facility] prior to dispensing any controlled drugs.           Patient's diagnosis is consistent with fall resulting in contusion of the back and a sprain of the right ankle.  Patient presented to the emergency department after mechanical fall while at work.  Patient did not hit her head or lose consciousness.  She is complaining of mid and lower back pain, right ankle pain.  Imaging is reassuring at this time.  Patient is neurologically intact.  This time no indication for further work-up.  Patient will be provided symptom control medications, crutches for ambulation.  Follow-up primary care or orthopedics as needed. Patient is given ED precautions to return to the ED for any worsening or new symptoms.     ____________________________________________  FINAL CLINICAL IMPRESSION(S) / ED DIAGNOSES  Final diagnoses:  Fall, initial encounter  Contusion of back, unspecified laterality, initial encounter  Sprain of right ankle, unspecified ligament, initial encounter      NEW MEDICATIONS STARTED DURING THIS VISIT:  ED Discharge Orders         Ordered    meloxicam (MOBIC) 15 MG tablet  Daily     01/01/19 1943    methocarbamol (ROBAXIN) 500 MG tablet  4 times daily      01/01/19 1943    HYDROcodone-acetaminophen (NORCO/VICODIN) 5-325 MG tablet  Every 4 hours PRN     01/01/19 1943              This chart was dictated using voice recognition software/Dragon. Despite best efforts to proofread, errors can occur which can change the meaning. Any change was purely unintentional.    Racheal PatchesCuthriell, Janyiah Silveri D, PA-C 01/01/19 1945    Miguel AschoffMonks, Sarah L., MD 01/02/19 1322

## 2019-01-27 ENCOUNTER — Ambulatory Visit: Payer: Medicaid - Out of State | Attending: Internal Medicine

## 2019-01-27 ENCOUNTER — Other Ambulatory Visit: Payer: Self-pay

## 2019-01-27 DIAGNOSIS — Z20822 Contact with and (suspected) exposure to covid-19: Secondary | ICD-10-CM | POA: Insufficient documentation

## 2019-01-28 LAB — NOVEL CORONAVIRUS, NAA: SARS-CoV-2, NAA: NOT DETECTED

## 2019-05-04 ENCOUNTER — Other Ambulatory Visit: Payer: Medicaid - Out of State

## 2019-05-04 ENCOUNTER — Ambulatory Visit: Payer: Medicaid - Out of State | Attending: Internal Medicine

## 2019-05-04 ENCOUNTER — Other Ambulatory Visit: Payer: Self-pay

## 2019-05-04 DIAGNOSIS — Z20822 Contact with and (suspected) exposure to covid-19: Secondary | ICD-10-CM

## 2019-05-05 LAB — SARS-COV-2, NAA 2 DAY TAT

## 2019-05-05 LAB — NOVEL CORONAVIRUS, NAA: SARS-CoV-2, NAA: NOT DETECTED

## 2019-06-07 ENCOUNTER — Emergency Department
Admission: EM | Admit: 2019-06-07 | Discharge: 2019-06-07 | Disposition: A | Discharge status: Home / Self Care | Payer: Medicaid - Out of State | Attending: Emergency Medicine | Admitting: Emergency Medicine

## 2019-06-07 ENCOUNTER — Other Ambulatory Visit: Payer: Self-pay

## 2019-06-07 ENCOUNTER — Encounter: Payer: Self-pay | Admitting: Emergency Medicine

## 2019-06-07 DIAGNOSIS — R519 Headache, unspecified: Secondary | ICD-10-CM | POA: Diagnosis not present

## 2019-06-07 DIAGNOSIS — Z5321 Procedure and treatment not carried out due to patient leaving prior to being seen by health care provider: Secondary | ICD-10-CM | POA: Diagnosis not present

## 2019-06-07 DIAGNOSIS — R42 Dizziness and giddiness: Secondary | ICD-10-CM | POA: Diagnosis present

## 2019-06-07 LAB — BASIC METABOLIC PANEL
Anion gap: 9 (ref 5–15)
BUN: 14 mg/dL (ref 6–20)
CO2: 24 mmol/L (ref 22–32)
Calcium: 8.9 mg/dL (ref 8.9–10.3)
Chloride: 103 mmol/L (ref 98–111)
Creatinine, Ser: 0.75 mg/dL (ref 0.44–1.00)
GFR calc Af Amer: >60 mL/min (ref >60)
GFR calc non Af Amer: >60 mL/min (ref >60)
Glucose, Bld: 109 mg/dL — ABNORMAL HIGH (ref 70–99)
Potassium: 4 mmol/L (ref 3.5–5.1)
Sodium: 136 mmol/L (ref 135–145)

## 2019-06-07 LAB — CBC
HCT: 38.9 % (ref 36.0–46.0)
Hemoglobin: 12.8 g/dL (ref 12.0–15.0)
MCH: 26.7 pg (ref 26.0–34.0)
MCHC: 32.9 g/dL (ref 30.0–36.0)
MCV: 81 fL (ref 80.0–100.0)
Platelets: 370 10*3/uL (ref 150–400)
RBC: 4.8 MIL/uL (ref 3.87–5.11)
RDW: 13.5 % (ref 11.5–15.5)
WBC: 9 10*3/uL (ref 4.0–10.5)
nRBC: 0 % (ref 0.0–0.2)

## 2019-06-07 NOTE — ED Triage Notes (Signed)
Pt got covid shot at 1245 today and now feeling lightheaded and having headache.  Pt states " I just don't feel normal".  Unlabored. VSS. No fever. C/o nausea. No vomiting or diarrhea.

## 2019-07-19 ENCOUNTER — Encounter (HOSPITAL_COMMUNITY): Payer: Self-pay | Admitting: Emergency Medicine

## 2019-07-19 ENCOUNTER — Emergency Department (HOSPITAL_COMMUNITY): Payer: Medicaid - Out of State

## 2019-07-19 ENCOUNTER — Emergency Department (HOSPITAL_COMMUNITY)
Admission: EM | Admit: 2019-07-19 | Discharge: 2019-07-19 | Disposition: A | Discharge status: Home / Self Care | Payer: Medicaid - Out of State | Attending: Emergency Medicine | Admitting: Emergency Medicine

## 2019-07-19 ENCOUNTER — Other Ambulatory Visit: Payer: Self-pay

## 2019-07-19 DIAGNOSIS — M25531 Pain in right wrist: Secondary | ICD-10-CM | POA: Diagnosis not present

## 2019-07-19 DIAGNOSIS — M79671 Pain in right foot: Secondary | ICD-10-CM | POA: Diagnosis not present

## 2019-07-19 DIAGNOSIS — Z5321 Procedure and treatment not carried out due to patient leaving prior to being seen by health care provider: Secondary | ICD-10-CM | POA: Insufficient documentation

## 2019-07-19 DIAGNOSIS — W108XXA Fall (on) (from) other stairs and steps, initial encounter: Secondary | ICD-10-CM | POA: Diagnosis not present

## 2019-07-19 NOTE — ED Triage Notes (Signed)
Pt states the dog made her fall down steps Saturday morning. Pt c/o right foot and right wrist pain.

## 2019-07-19 NOTE — ED Notes (Signed)
Pt called no answer 

## 2019-07-19 NOTE — ED Notes (Signed)
Called no answer

## 2019-07-20 ENCOUNTER — Other Ambulatory Visit: Payer: Self-pay

## 2019-07-20 ENCOUNTER — Emergency Department (HOSPITAL_COMMUNITY)
Admission: EM | Admit: 2019-07-20 | Discharge: 2019-07-20 | Disposition: A | Discharge status: Home / Self Care | Payer: Medicaid - Out of State | Attending: Emergency Medicine | Admitting: Emergency Medicine

## 2019-07-20 ENCOUNTER — Emergency Department (HOSPITAL_COMMUNITY): Payer: Medicaid - Out of State

## 2019-07-20 ENCOUNTER — Encounter (HOSPITAL_COMMUNITY): Payer: Self-pay | Admitting: *Deleted

## 2019-07-20 DIAGNOSIS — S99911A Unspecified injury of right ankle, initial encounter: Secondary | ICD-10-CM | POA: Diagnosis present

## 2019-07-20 DIAGNOSIS — Y999 Unspecified external cause status: Secondary | ICD-10-CM | POA: Diagnosis not present

## 2019-07-20 DIAGNOSIS — W109XXA Fall (on) (from) unspecified stairs and steps, initial encounter: Secondary | ICD-10-CM | POA: Diagnosis not present

## 2019-07-20 DIAGNOSIS — Y92009 Unspecified place in unspecified non-institutional (private) residence as the place of occurrence of the external cause: Secondary | ICD-10-CM | POA: Insufficient documentation

## 2019-07-20 DIAGNOSIS — S63501A Unspecified sprain of right wrist, initial encounter: Secondary | ICD-10-CM | POA: Diagnosis not present

## 2019-07-20 DIAGNOSIS — Y939 Activity, unspecified: Secondary | ICD-10-CM | POA: Diagnosis not present

## 2019-07-20 DIAGNOSIS — F17200 Nicotine dependence, unspecified, uncomplicated: Secondary | ICD-10-CM | POA: Insufficient documentation

## 2019-07-20 DIAGNOSIS — J45909 Unspecified asthma, uncomplicated: Secondary | ICD-10-CM | POA: Insufficient documentation

## 2019-07-20 DIAGNOSIS — S93401A Sprain of unspecified ligament of right ankle, initial encounter: Secondary | ICD-10-CM | POA: Insufficient documentation

## 2019-07-20 MED ORDER — DICLOFENAC SODIUM 75 MG PO TBEC: 75.0000 mg | DELAYED_RELEASE_TABLET | Freq: Two times a day (BID) | ORAL | 0 refills | Status: AC

## 2019-07-20 NOTE — Discharge Instructions (Signed)
Elevate and apply ice packs on and off to your wrist and ankle.  Minimal weightbearing, no excessive use of your right wrist for 1 week.  You may contact one of the orthopedic providers listed to arrange follow-up appointment if not improving.

## 2019-07-20 NOTE — ED Triage Notes (Signed)
Fell off steps yesterday, pain in right ankle, foot and wrist

## 2019-07-20 NOTE — ED Provider Notes (Signed)
[facility] EMERGENCY DEPARTMENT Provider Note   CSN: [id number] Arrival date & time: 07/20/19  1037     History Chief Complaint  Patient presents with  . Wrist Pain  . Ankle Pain  . Foot Pain    Tammy Zamora is a 44 y.o. female.  HPI      Tammy Zamora is a 44 y.o. female who presents to the Emergency Department complaining of pain and swelling of her right wrist and ankle.  Symptoms began last evening after mechanical fall down 3 steps at her home.  She states that her dog ran out in front of her causing the fall.  States that she put her right hand out to try to brace herself.  She notes increasing pain to the distal wrist and lateral ankle.  Pain is worse with movement and weightbearing, improves at rest.  She denies head injury, LOC, neck pain, back pain, dizziness or headache.  Nausea or vomiting.  No numbness to the fingers of her hand or toes of her foot, but states that she has numbness of her heel when she stands.      Past Medical History:  Diagnosis Date  . Asthma   . Hyperlipemia 2015  . PTSD (post-traumatic stress disorder) 2010    There are no problems to display for this patient.   Past Surgical History:  Procedure Laterality Date  . KNEE SURGERY    . NECK SURGERY    . TUBAL LIGATION       OB History    Gravida  3   Para  3   Term  3   Preterm      AB      Living        SAB      TAB      Ectopic      Multiple      Live Births              Family History  Problem Relation Age of Onset  . Cancer Other   . Stroke Other   . Heart attack Other     Social History   Tobacco Use  . Smoking status: Current Every Day Smoker  . Smokeless tobacco: Never Used  Vaping Use  . Vaping Use: Never used  Substance Use Topics  . Alcohol use: Never  . Drug use: Never    Home Medications Prior to Admission medications   Medication Sig Start Date End Date Taking? Authorizing Provider  HYDROcodone-acetaminophen (NORCO/VICODIN)  5-325 MG tablet Take 1 tablet by mouth every 4 (four) hours as needed for moderate pain. 01/01/19   Cuthriell, Delorise Royals, PA-C  meloxicam (MOBIC) 15 MG tablet Take 1 tablet (15 mg total) by mouth daily. 01/01/19   Cuthriell, Delorise Royals, PA-C  methocarbamol (ROBAXIN) 500 MG tablet Take 1 tablet (500 mg total) by mouth 4 (four) times daily. 01/01/19   Cuthriell, Delorise Royals, PA-C    Allergies    Morphine and related  Review of Systems   Review of Systems  Constitutional: Negative for chills and fever.  Eyes: Negative for visual disturbance.  Gastrointestinal: Negative for nausea and vomiting.  Musculoskeletal: Positive for arthralgias (Pain and swelling of the right ankle and pain of the right wrist). Negative for back pain, joint swelling and neck pain.  Skin: Negative for color change and wound.  Neurological: Negative for dizziness, syncope, weakness, light-headedness and headaches.  Psychiatric/Behavioral: Negative for confusion.    Physical Exam Updated Vital Signs  BP 136/77 (BP Location: Left Arm)   Pulse 71   Temp 98.6 F (37 C) (Oral)   Resp 16   Ht 5\' 10"  (1.778 m)   Wt 113.4 kg   LMP 07/06/2019   SpO2 97%   BMI 35.87 kg/m   Physical Exam Vitals and nursing note reviewed.  Constitutional:      Appearance: Normal appearance. She is not ill-appearing.  HENT:     Head: Atraumatic.  Cardiovascular:     Rate and Rhythm: Normal rate and regular rhythm.     Pulses: Normal pulses.  Pulmonary:     Effort: Pulmonary effort is normal.     Breath sounds: Normal breath sounds.  Musculoskeletal:        General: Swelling, tenderness and signs of injury present.     Cervical back: Normal range of motion.     Comments: Tenderness to palpation of the distal right wrist.  No snuffbox tenderness, no bony deformity no significant edema noted.  No tenderness of the right elbow or shoulder.  Tenderness to palpation and mild to moderate edema noted of the lateral right malleolus.  No  bony deformity.  No proximal tenderness.  Skin:    General: Skin is warm.     Capillary Refill: Capillary refill takes less than 2 seconds.     Findings: No bruising or erythema.  Neurological:     General: No focal deficit present.     Mental Status: She is alert.     ED Results / Procedures / Treatments   Labs (all labs ordered are listed, but only abnormal results are displayed) Labs Reviewed - No data to display  EKG None  Radiology DG Wrist Complete Right  Result Date: 07/20/2019 CLINICAL DATA:  Recent fall with pain EXAM: RIGHT WRIST - COMPLETE 3+ VIEW COMPARISON:  None. FINDINGS: There is no evidence of fracture or dislocation. There is no evidence of arthropathy or other focal bone abnormality. Soft tissues are unremarkable. IMPRESSION: No acute osseous finding Electronically Signed   By: 09/20/2019.  Shick M.D.   On: 07/20/2019 12:25   DG Ankle Complete Right  Result Date: 07/20/2019 CLINICAL DATA:  Fall, pain in RIGHT ankle and RIGHT wrist EXAM: RIGHT ANKLE - COMPLETE 3+ VIEW COMPARISON:  01/01/2019 FINDINGS: Mild soft tissue swelling over lateral malleolus. Ankle mortise is normal. No sign of joint effusion. Mild midfoot degenerative changes and plantar enthesopathy. IMPRESSION: Mild soft tissue swelling over the lateral malleolus. No acute osseous abnormality. Electronically Signed   By: 01/03/2019 M.D.   On: 07/20/2019 12:26    Procedures Procedures (including critical care time)  Medications Ordered in ED Medications - No data to display  ED Course  I have reviewed the triage vital signs and the nursing notes.  Pertinent labs & imaging results that were available during my care of the patient were reviewed by me and considered in my medical decision making (see chart for details).    MDM Rules/Calculators/A&P                           Patient here with complaints of right wrist and ankle pain secondary to a mechanical fall that occurred last evening.   Neurovascularly intact.  Ambulatory with a steady gait.  X-rays of the right ankle and wrist are negative for acute bony injury.  Symptoms are likely musculoskeletal.  She agrees to treatment plan with RICE therapy, NSAID for pain and orthopedic follow-up in  1 week if not improving.  Velcro wrist splint and ASO brace applied.  Patient states she has crutches at home.  Final Clinical Impression(s) / ED Diagnoses Final diagnoses:  Sprain of right ankle, unspecified ligament, initial encounter  Sprain of right wrist, initial encounter    Rx / DC Orders ED Discharge Orders    None       Pauline Aus, PA-C 07/20/19 1325    Jacalyn Lefevre, MD 07/20/19 1429

## 2019-09-08 ENCOUNTER — Emergency Department (HOSPITAL_COMMUNITY)
Admission: EM | Admit: 2019-09-08 | Discharge: 2019-09-08 | Disposition: A | Discharge status: Home / Self Care | Payer: Medicaid - Out of State | Attending: Emergency Medicine | Admitting: Emergency Medicine

## 2019-09-08 ENCOUNTER — Encounter (HOSPITAL_COMMUNITY): Payer: Self-pay | Admitting: Emergency Medicine

## 2019-09-08 ENCOUNTER — Other Ambulatory Visit: Payer: Self-pay

## 2019-09-08 DIAGNOSIS — Z5321 Procedure and treatment not carried out due to patient leaving prior to being seen by health care provider: Secondary | ICD-10-CM | POA: Insufficient documentation

## 2019-09-08 DIAGNOSIS — F410 Panic disorder [episodic paroxysmal anxiety] without agoraphobia: Secondary | ICD-10-CM | POA: Insufficient documentation

## 2019-09-08 LAB — BASIC METABOLIC PANEL
Anion gap: 11 (ref 5–15)
BUN: 10 mg/dL (ref 6–20)
CO2: 23 mmol/L (ref 22–32)
Calcium: 8.7 mg/dL — ABNORMAL LOW (ref 8.9–10.3)
Chloride: 105 mmol/L (ref 98–111)
Creatinine, Ser: 0.73 mg/dL (ref 0.44–1.00)
GFR calc Af Amer: >60 mL/min (ref >60)
GFR calc non Af Amer: >60 mL/min (ref >60)
Glucose, Bld: 89 mg/dL (ref 70–99)
Potassium: 4 mmol/L (ref 3.5–5.1)
Sodium: 139 mmol/L (ref 135–145)

## 2019-09-08 LAB — CBC
HCT: 40.9 % (ref 36.0–46.0)
Hemoglobin: 13.1 g/dL (ref 12.0–15.0)
MCH: 26.7 pg (ref 26.0–34.0)
MCHC: 32 g/dL (ref 30.0–36.0)
MCV: 83.5 fL (ref 80.0–100.0)
Platelets: 372 10*3/uL (ref 150–400)
RBC: 4.9 MIL/uL (ref 3.87–5.11)
RDW: 14.8 % (ref 11.5–15.5)
WBC: 8.7 10*3/uL (ref 4.0–10.5)
nRBC: 0 % (ref 0.0–0.2)

## 2019-09-08 LAB — TROPONIN I (HIGH SENSITIVITY): Troponin I (High Sensitivity): <2 ng/L (ref <18)

## 2019-09-08 NOTE — ED Triage Notes (Signed)
Patient states she has had intermittent  panic attacks since 0630 this morning.

## 2019-09-09 ENCOUNTER — Emergency Department (HOSPITAL_COMMUNITY)
Admission: EM | Admit: 2019-09-09 | Discharge: 2019-09-09 | Disposition: A | Discharge status: Home / Self Care | Payer: Medicaid - Out of State

## 2019-09-09 NOTE — ED Notes (Signed)
No answer in waiting room 

## 2019-10-26 ENCOUNTER — Emergency Department: Payer: Medicaid - Out of State

## 2019-10-26 ENCOUNTER — Encounter: Payer: Self-pay | Admitting: Emergency Medicine

## 2019-10-26 ENCOUNTER — Other Ambulatory Visit: Payer: Self-pay

## 2019-10-26 DIAGNOSIS — Z5321 Procedure and treatment not carried out due to patient leaving prior to being seen by health care provider: Secondary | ICD-10-CM | POA: Diagnosis not present

## 2019-10-26 DIAGNOSIS — R0602 Shortness of breath: Secondary | ICD-10-CM | POA: Insufficient documentation

## 2019-10-26 DIAGNOSIS — M25512 Pain in left shoulder: Secondary | ICD-10-CM | POA: Insufficient documentation

## 2019-10-26 LAB — CBC WITH DIFFERENTIAL/PLATELET
Abs Immature Granulocytes: 0.04 10*3/uL (ref 0.00–0.07)
Basophils Absolute: 0.1 10*3/uL (ref 0.0–0.1)
Basophils Relative: 1 %
Eosinophils Absolute: 0.3 10*3/uL (ref 0.0–0.5)
Eosinophils Relative: 2 %
HCT: 35.9 % — ABNORMAL LOW (ref 36.0–46.0)
Hemoglobin: 11.9 g/dL — ABNORMAL LOW (ref 12.0–15.0)
Immature Granulocytes: 0 %
Lymphocytes Relative: 35 %
Lymphs Abs: 4 10*3/uL (ref 0.7–4.0)
MCH: 27 pg (ref 26.0–34.0)
MCHC: 33.1 g/dL (ref 30.0–36.0)
MCV: 81.6 fL (ref 80.0–100.0)
Monocytes Absolute: 0.7 10*3/uL (ref 0.1–1.0)
Monocytes Relative: 6 %
Neutro Abs: 6.5 10*3/uL (ref 1.7–7.7)
Neutrophils Relative %: 56 %
Platelets: 335 10*3/uL (ref 150–400)
RBC: 4.4 MIL/uL (ref 3.87–5.11)
RDW: 14.4 % (ref 11.5–15.5)
WBC: 11.7 10*3/uL — ABNORMAL HIGH (ref 4.0–10.5)
nRBC: 0 % (ref 0.0–0.2)

## 2019-10-26 LAB — COMPREHENSIVE METABOLIC PANEL
ALT: 9 U/L (ref 0–44)
AST: 12 U/L — ABNORMAL LOW (ref 15–41)
Albumin: 3.7 g/dL (ref 3.5–5.0)
Alkaline Phosphatase: 52 U/L (ref 38–126)
Anion gap: 11 (ref 5–15)
BUN: 13 mg/dL (ref 6–20)
CO2: 22 mmol/L (ref 22–32)
Calcium: 8.5 mg/dL — ABNORMAL LOW (ref 8.9–10.3)
Chloride: 107 mmol/L (ref 98–111)
Creatinine, Ser: 0.74 mg/dL (ref 0.44–1.00)
GFR, Estimated: >60 mL/min (ref >60)
Glucose, Bld: 91 mg/dL (ref 70–99)
Potassium: 3.5 mmol/L (ref 3.5–5.1)
Sodium: 140 mmol/L (ref 135–145)
Total Bilirubin: 0.4 mg/dL (ref 0.3–1.2)
Total Protein: 6.7 g/dL (ref 6.5–8.1)

## 2019-10-26 LAB — TROPONIN I (HIGH SENSITIVITY): Troponin I (High Sensitivity): 2 ng/L (ref <18)

## 2019-10-26 LAB — POCT PREGNANCY, URINE: Preg Test, Ur: NEGATIVE

## 2019-10-26 NOTE — ED Triage Notes (Signed)
Pt presents to ED with sob for the past hour or two.  Pt states someone ran into her causing left shoulder pain for the past 3 weeks ago. The shoulder pain she is having makes it difficult for her to lie down and get comfortable. After attempting to lie down a couple of hours ago pt noticed a sudden onset of sob that has continued. Shoulder is painful to move. Pt has not been seen for her pain.

## 2019-10-26 NOTE — ED Notes (Signed)
No answer when called several times from lobby, outside & cell phone 

## 2019-10-27 ENCOUNTER — Emergency Department
Admission: EM | Admit: 2019-10-27 | Discharge: 2019-10-27 | Disposition: A | Discharge status: Home / Self Care | Payer: Medicaid - Out of State | Attending: Emergency Medicine | Admitting: Emergency Medicine

## 2019-10-27 HISTORY — DX: Panic disorder (episodic paroxysmal anxiety): F41.0

## 2020-06-04 IMAGING — CR DG KNEE COMPLETE 4+V*L*
4 series · 4 of 4 positions shown · non-contrast
Comparison: 09/19/2018

CLINICAL DATA: Fell yesterday trying to get into or truck, LEFT
knee pain post fall

EXAM:
LEFT KNEE - COMPLETE 4+ VIEW

[t  ap left]
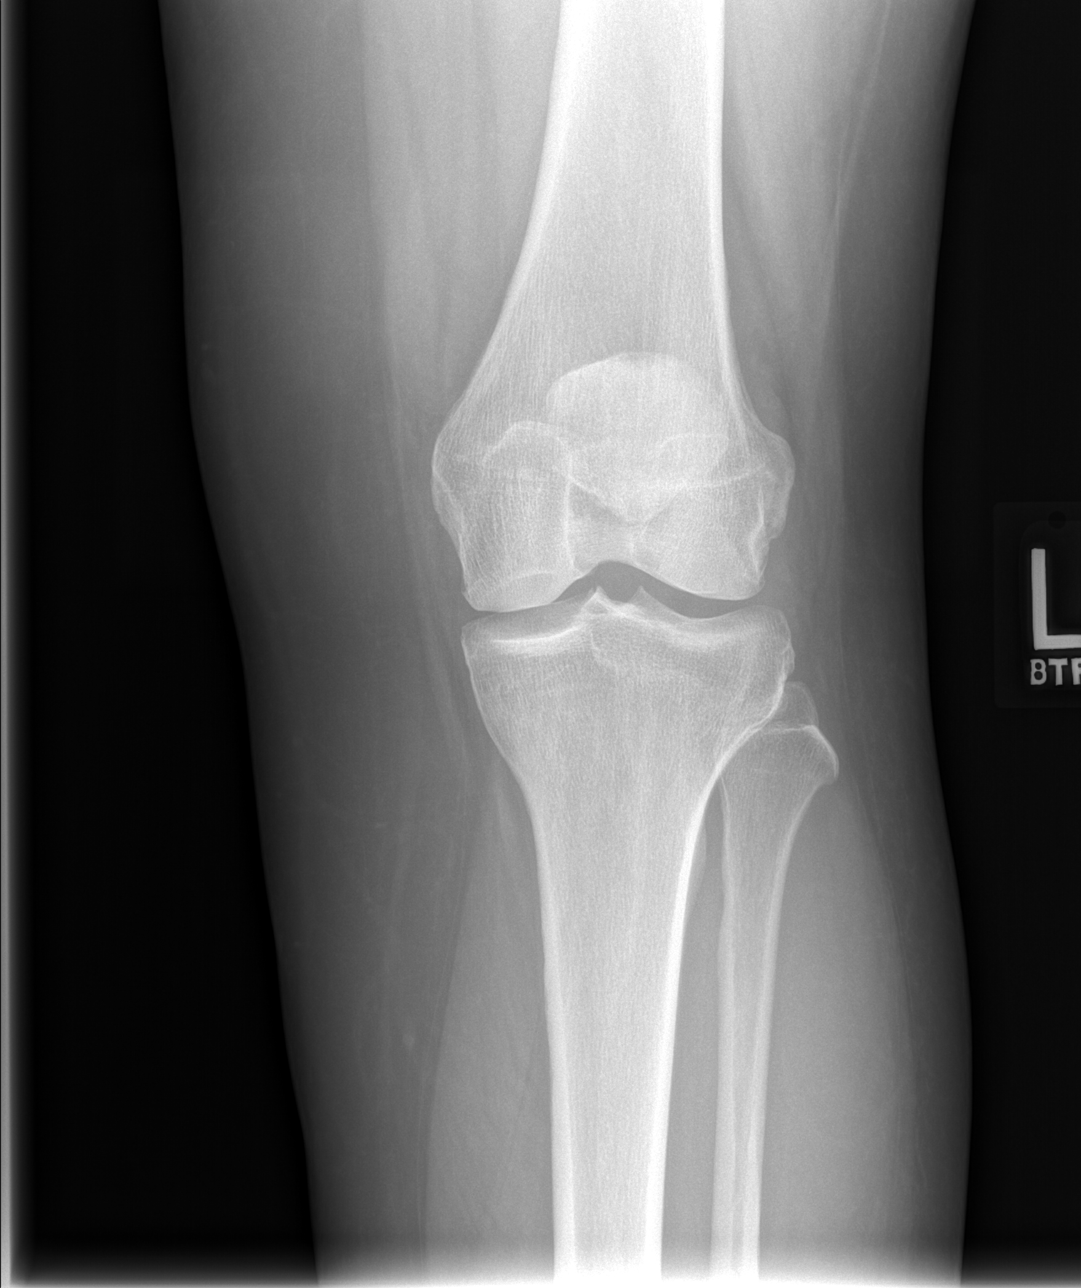

[t medial obique left (1 of 2)]
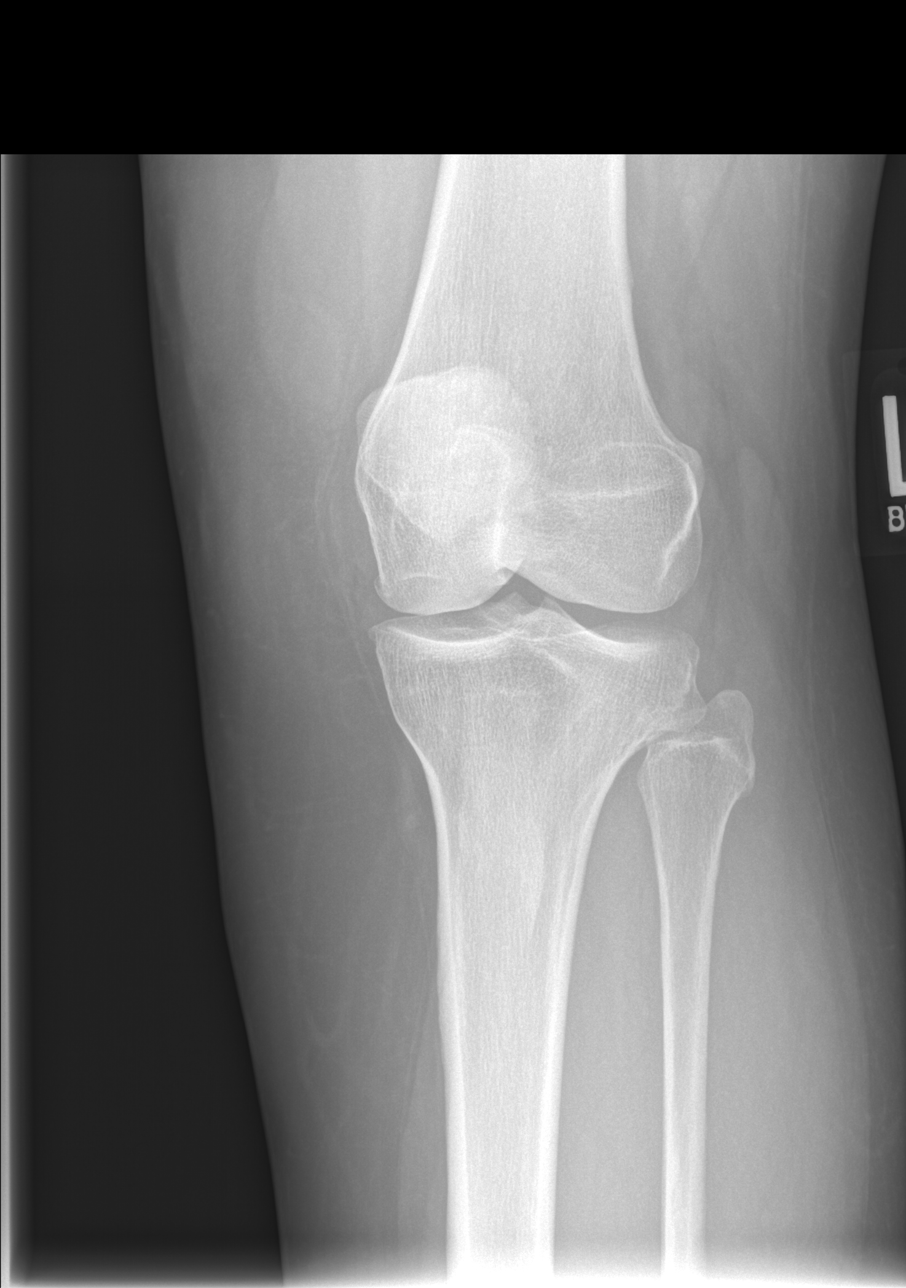

[t medial obique left (2 of 2)]
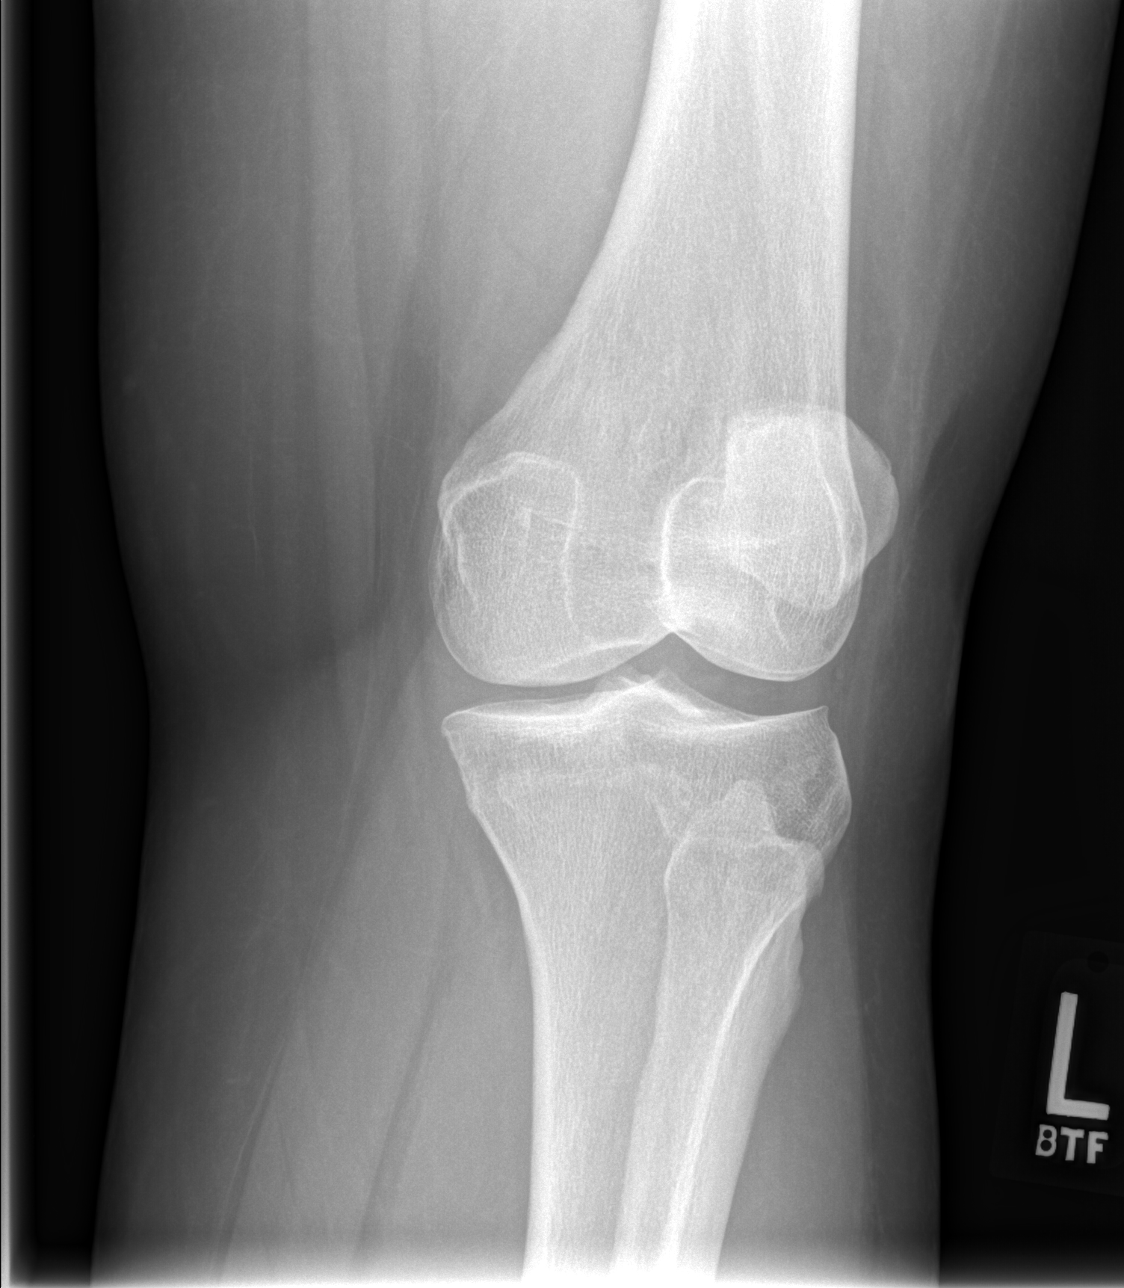

[t  lateral left]
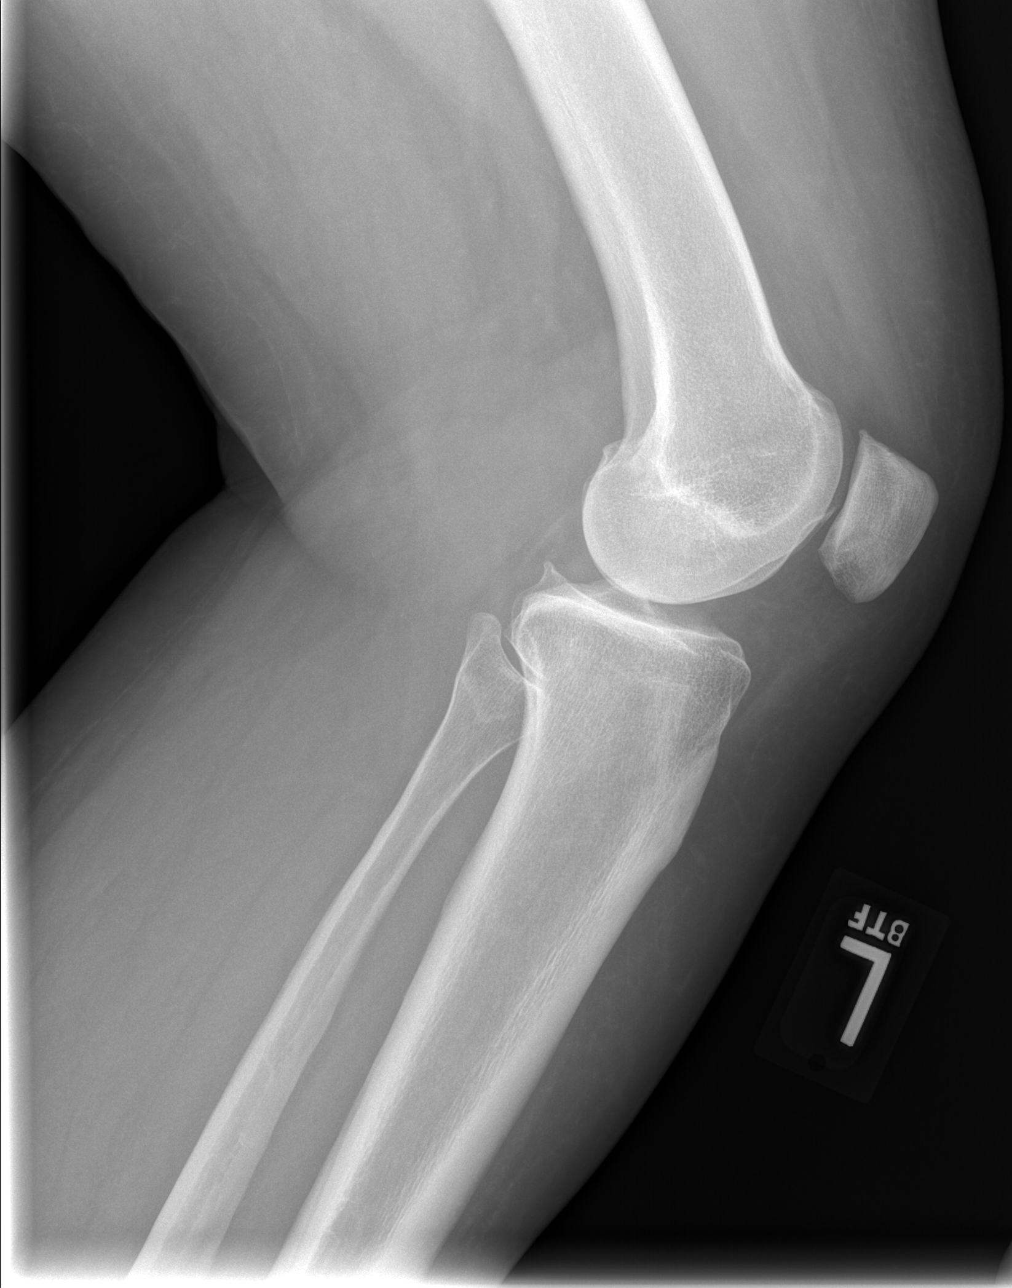

[4 of 4 positions shown; findings below may reference images not displayed]

FINDINGS: Osseous mineralization normal.

Minimal joint space narrowing and spur formation at medial
compartment.

No fracture, dislocation, or bone destruction.

No joint effusion.
IMPRESSION: Mild degenerative changes of the LEFT knee without acute bony
abnormalities.

## 2020-09-09 IMAGING — CR DG THORACIC SPINE 2V
1 series · 4 of 4 positions shown · non-contrast
Comparison: None.

CLINICAL DATA: Patient presents to the ED via [employer] from work at
[REDACTED] on [REDACTED]. Patient states she tripped over a palette
and fell on her right side. Patient is complaining of right
hip/lower back pain and right ankle/foot pain. Patient denies
hitting her head or losing consciousness.

EXAM:
THORACIC SPINE 2 VIEWS

[Series 1: dg thoracic spine 2 view · 0.14mm/px · 4 of 4 slices shown]
[im 1/4]
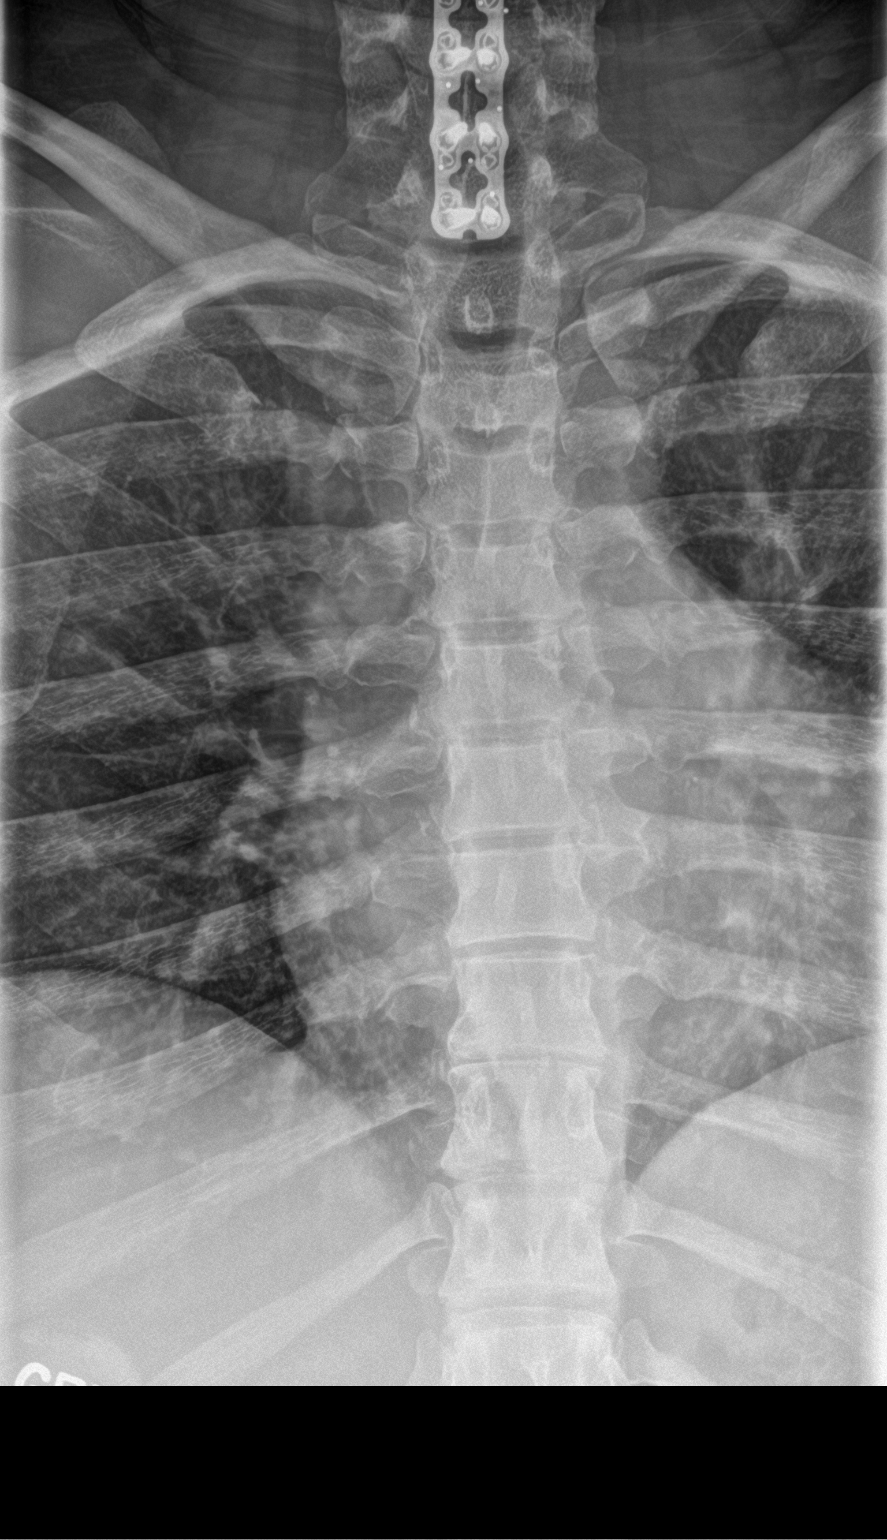
[im 2/4]
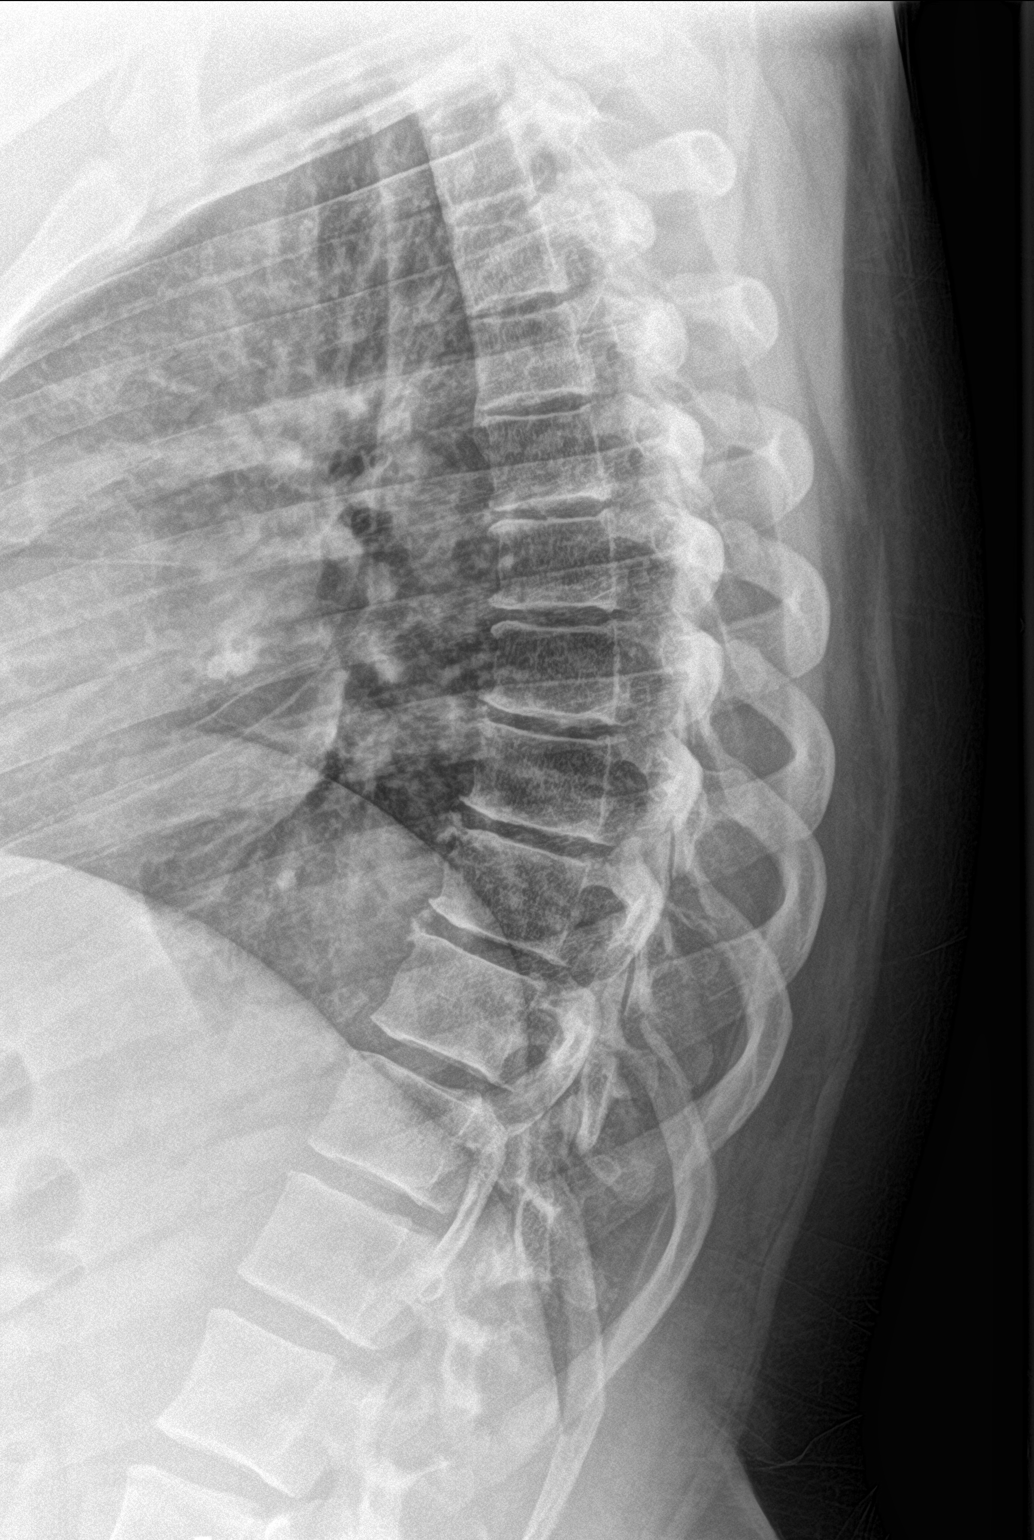
[im 3/4]
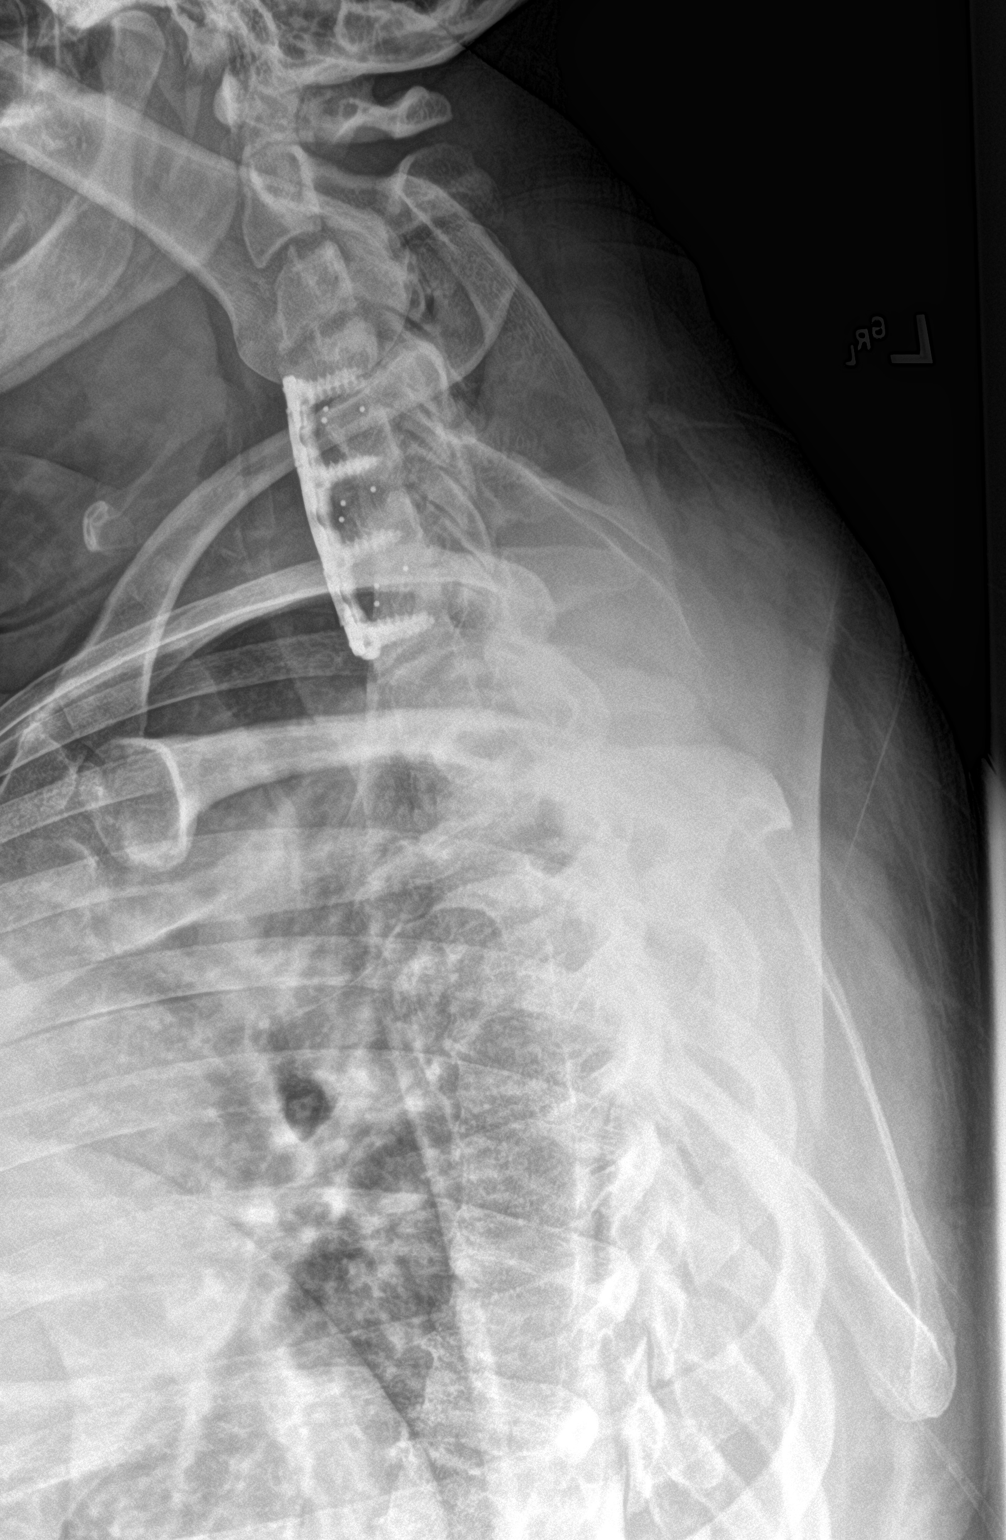
[im 4/4]
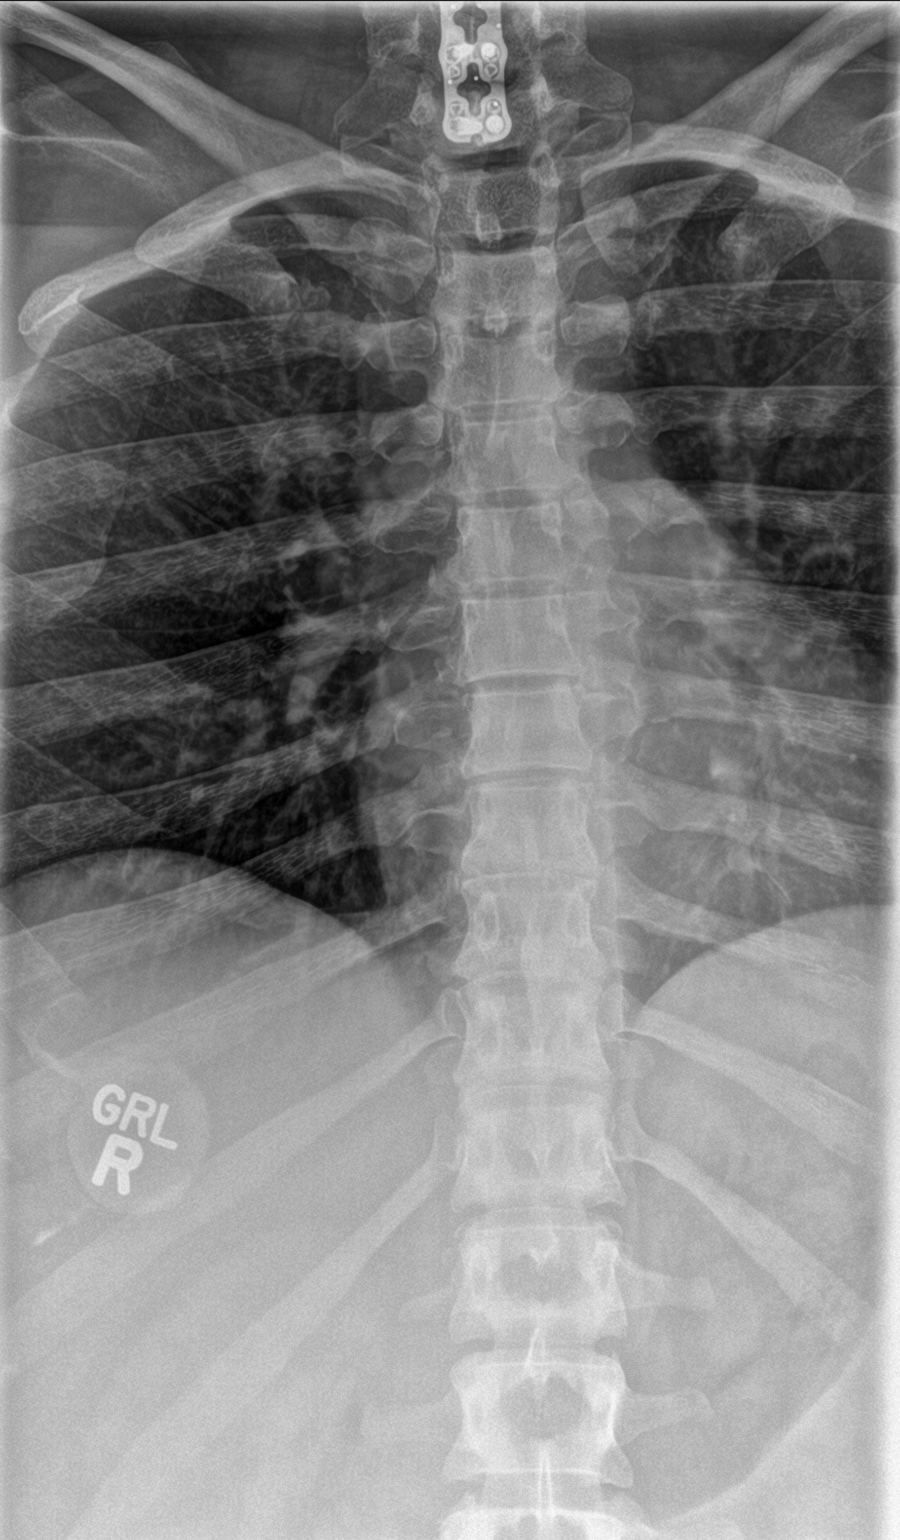

[4 of 4 positions shown; findings below may reference images not displayed]

FINDINGS: No fracture, bone lesion or spondylolisthesis.

Status post anterior cervical spine fusion C4 through C7. The
orthopedic hardware appears well seated.

Minor disc degenerative changes along mid to lower thoracic spine.

Soft tissues are unremarkable.
IMPRESSION: No fracture or acute finding.

## 2020-09-09 IMAGING — CR DG LUMBAR SPINE 2-3V
1 series · 3 of 3 positions shown · non-contrast
Comparison: None.

CLINICAL DATA: Patient presents to the ED via [employer] from work at
[REDACTED] on [REDACTED]. Patient states she tripped over a palette
and fell on her right side. Patient is complaining of right
hip/lower back pain and right ankle/foot pain. Patient denies
hitting her head or losing consciousness.

EXAM:
LUMBAR SPINE - 2-3 VIEW

[Series 1: dg lumbar spine 2-3 views · 0.14mm/px · 3 of 3 slices shown]
[im 1/3]
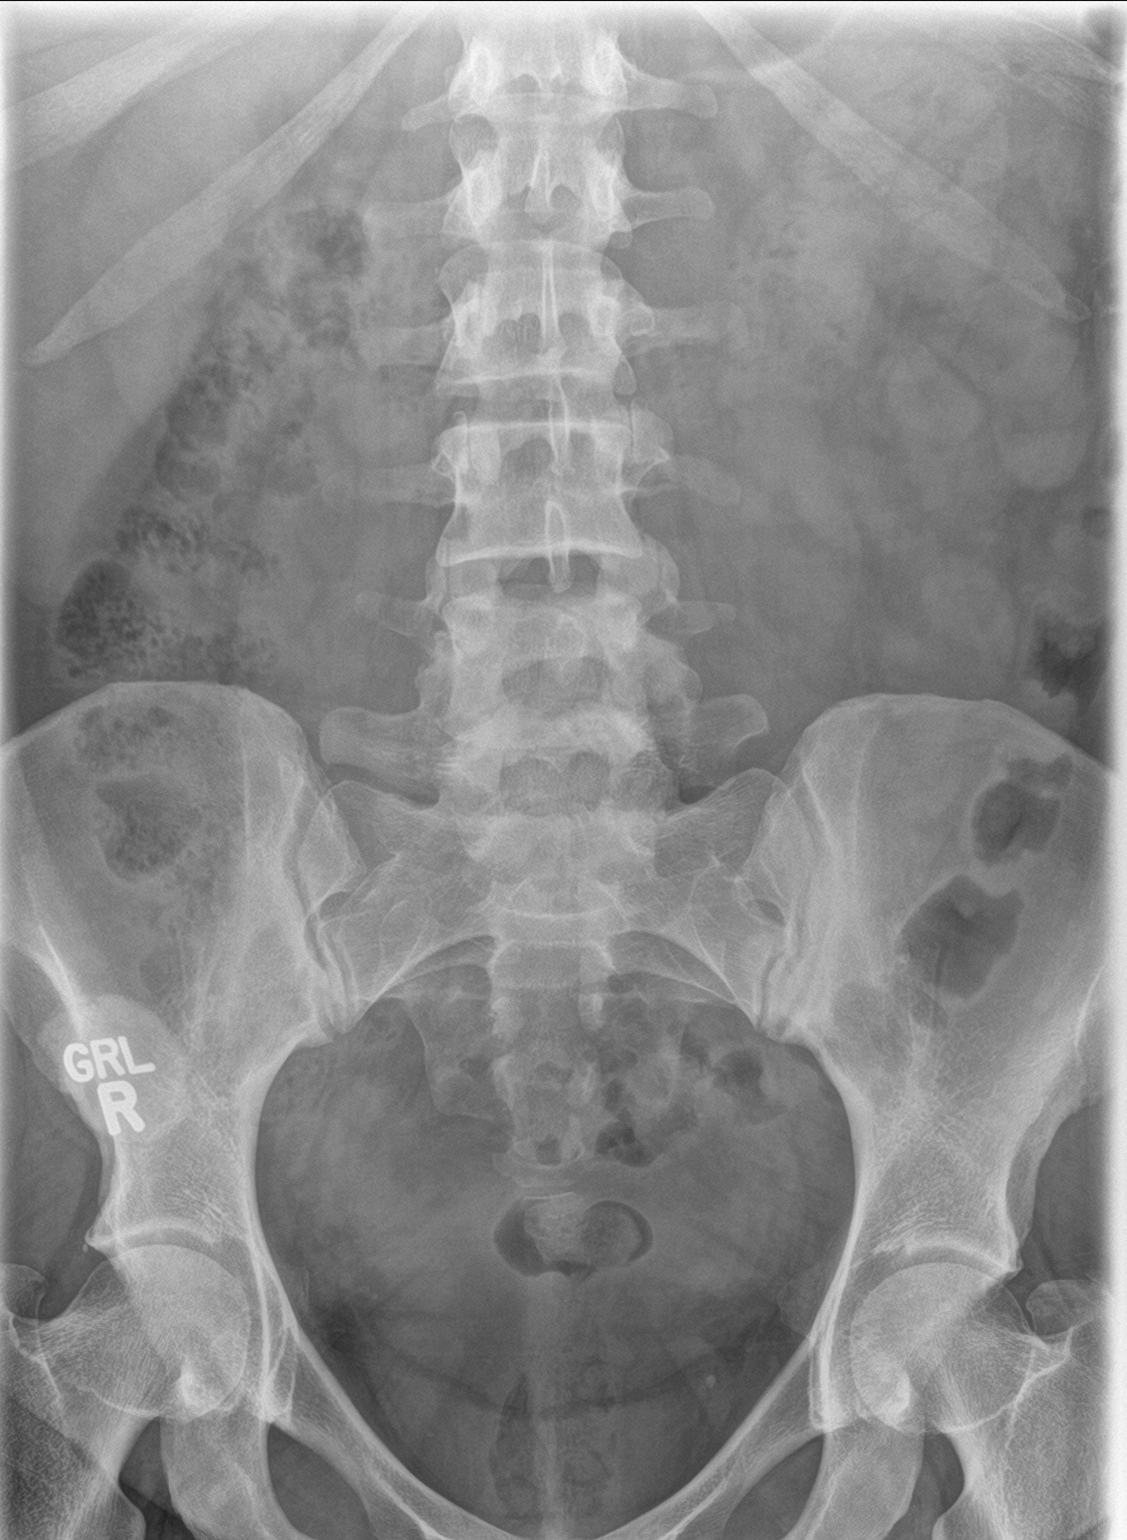
[im 2/3]
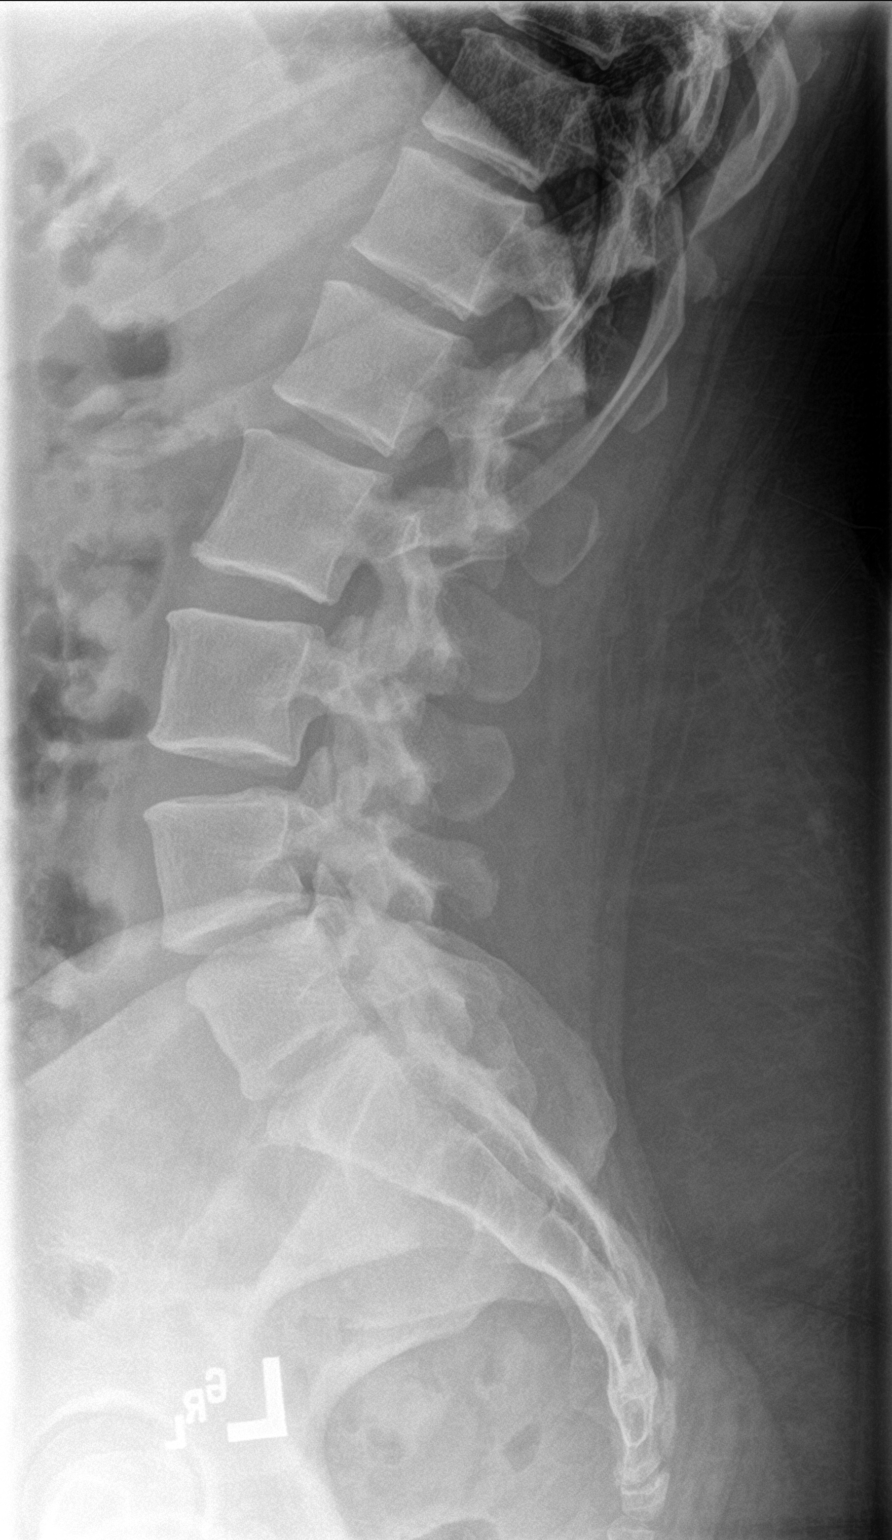
[im 3/3]
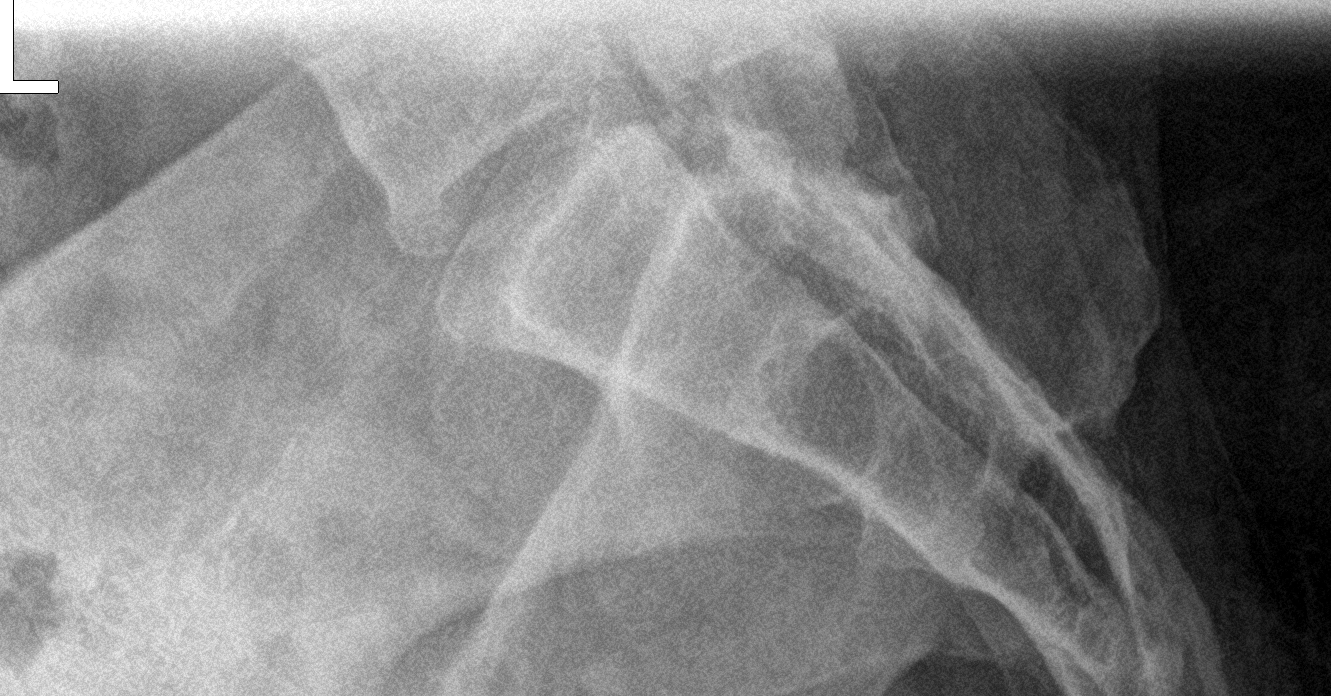

[3 of 3 positions shown; findings below may reference images not displayed]

FINDINGS: No fracture, bone lesion or spondylolisthesis.

Moderate loss of disc height at L4-L5 with mild loss of disc height
at L5-S1. Remaining lumbar discs are well preserved in height.

Soft tissues are unremarkable.
IMPRESSION: 1. No fracture or acute finding.
2. Disc degenerative changes at L4-L5 and L5-S1.

## 2021-07-04 IMAGING — CR DG SHOULDER 2+V*L*
1 series · 3 of 3 positions shown · non-contrast
Comparison: None.

CLINICAL DATA: Blunt trauma to the left shoulder 3 weeks ago with
persistent pain, initial encounter

EXAM:
LEFT SHOULDER - 2+ VIEW

[Series 1: dg shoulder left · 0.14mm/px · 3 of 3 slices shown]
[im 1/3]
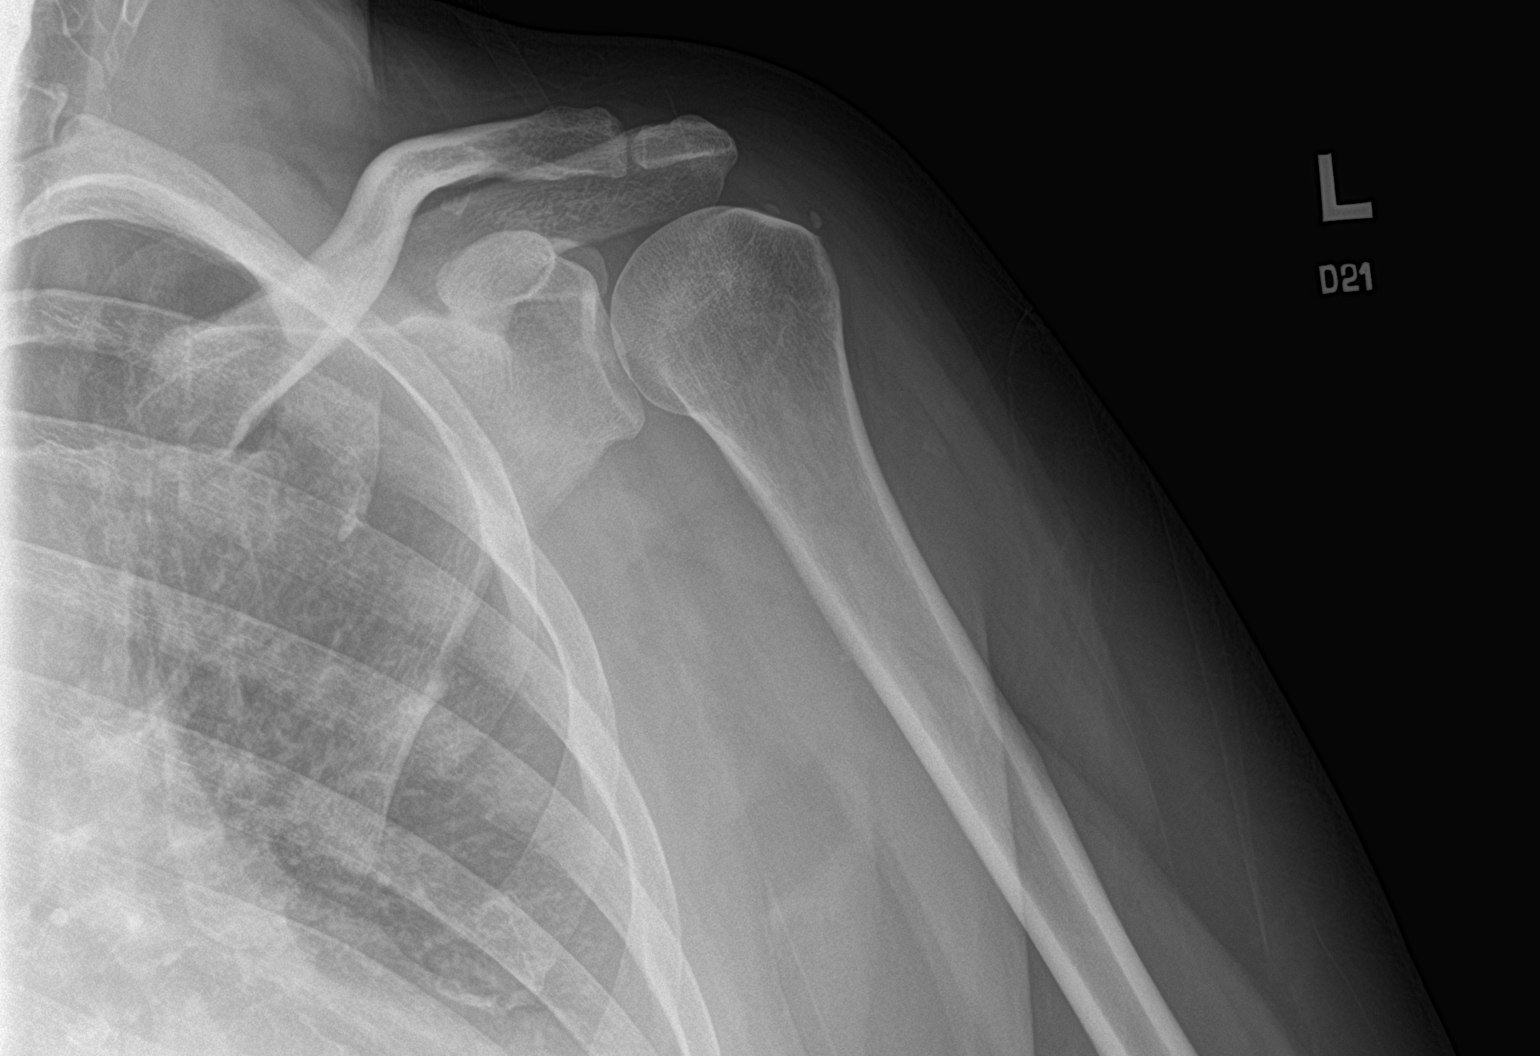
[im 2/3]
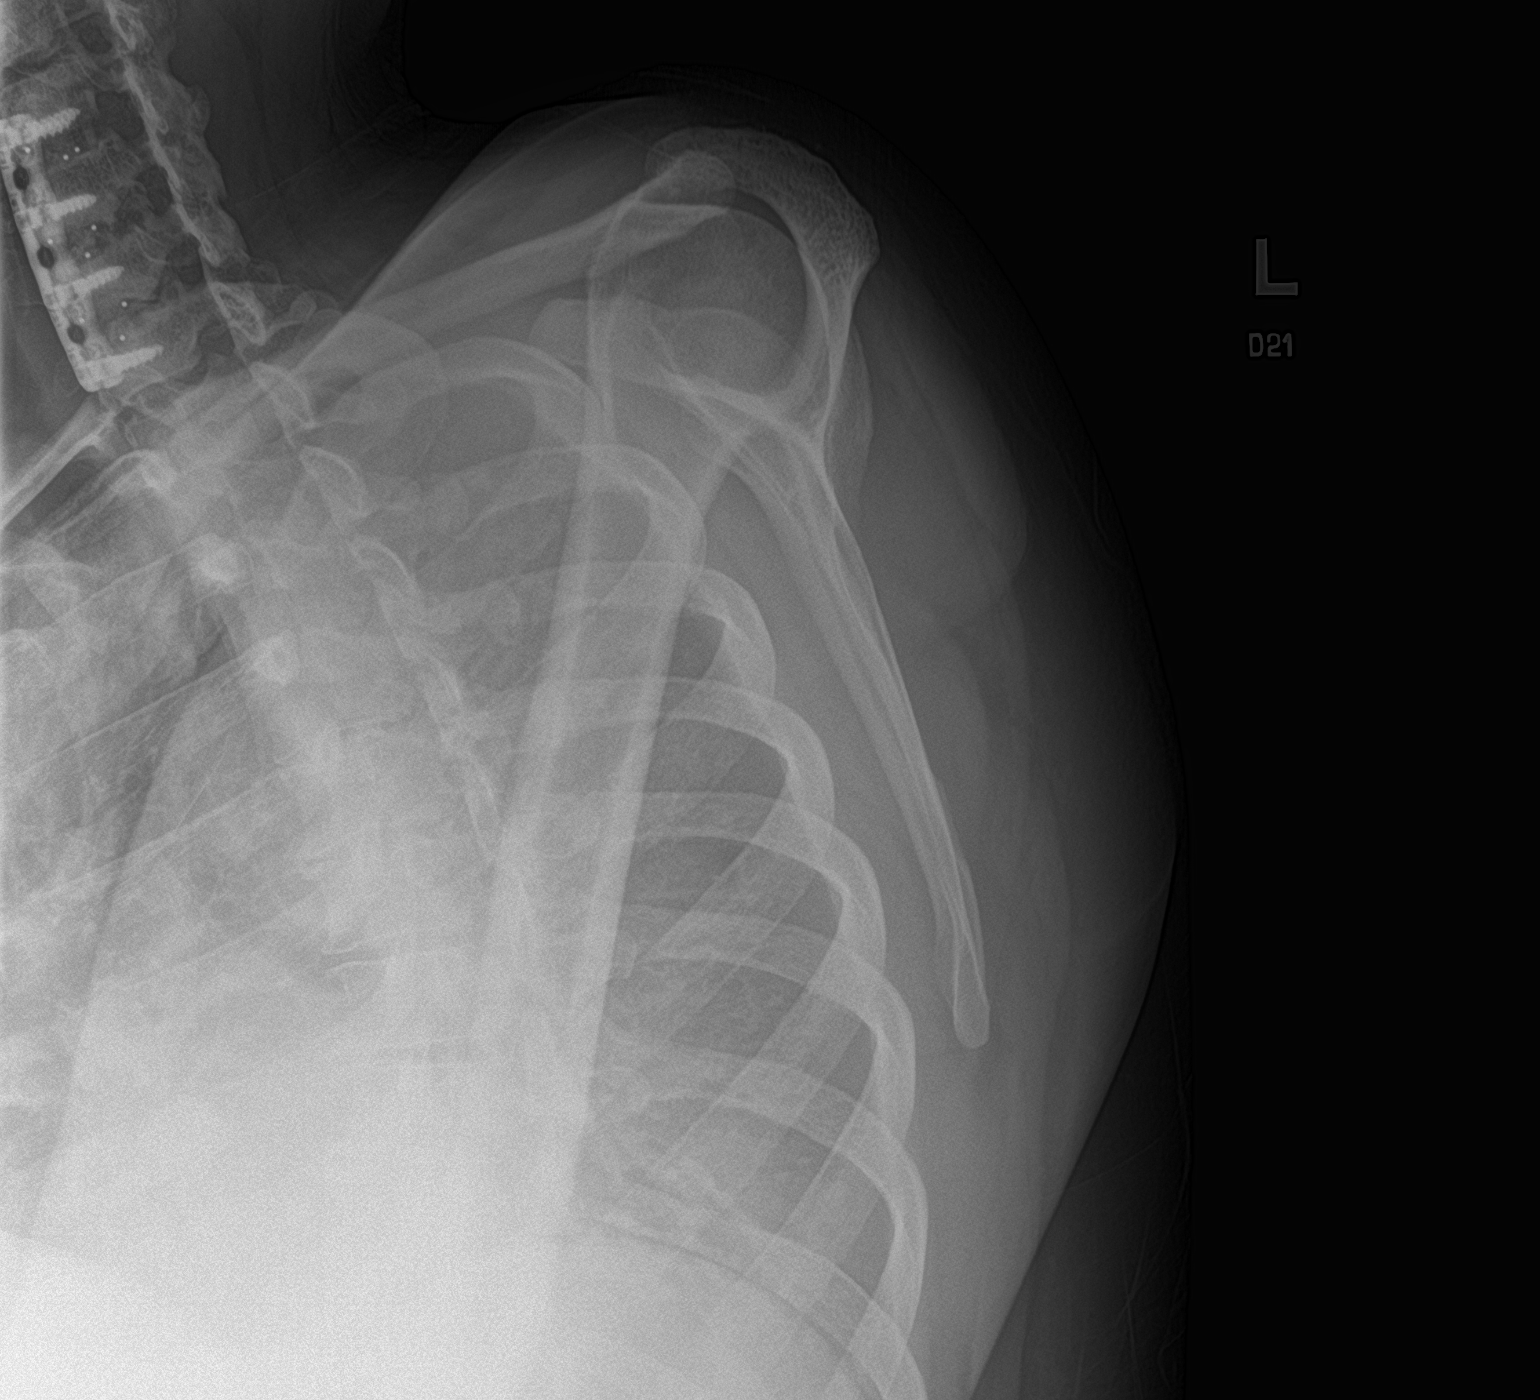
[im 3/3]
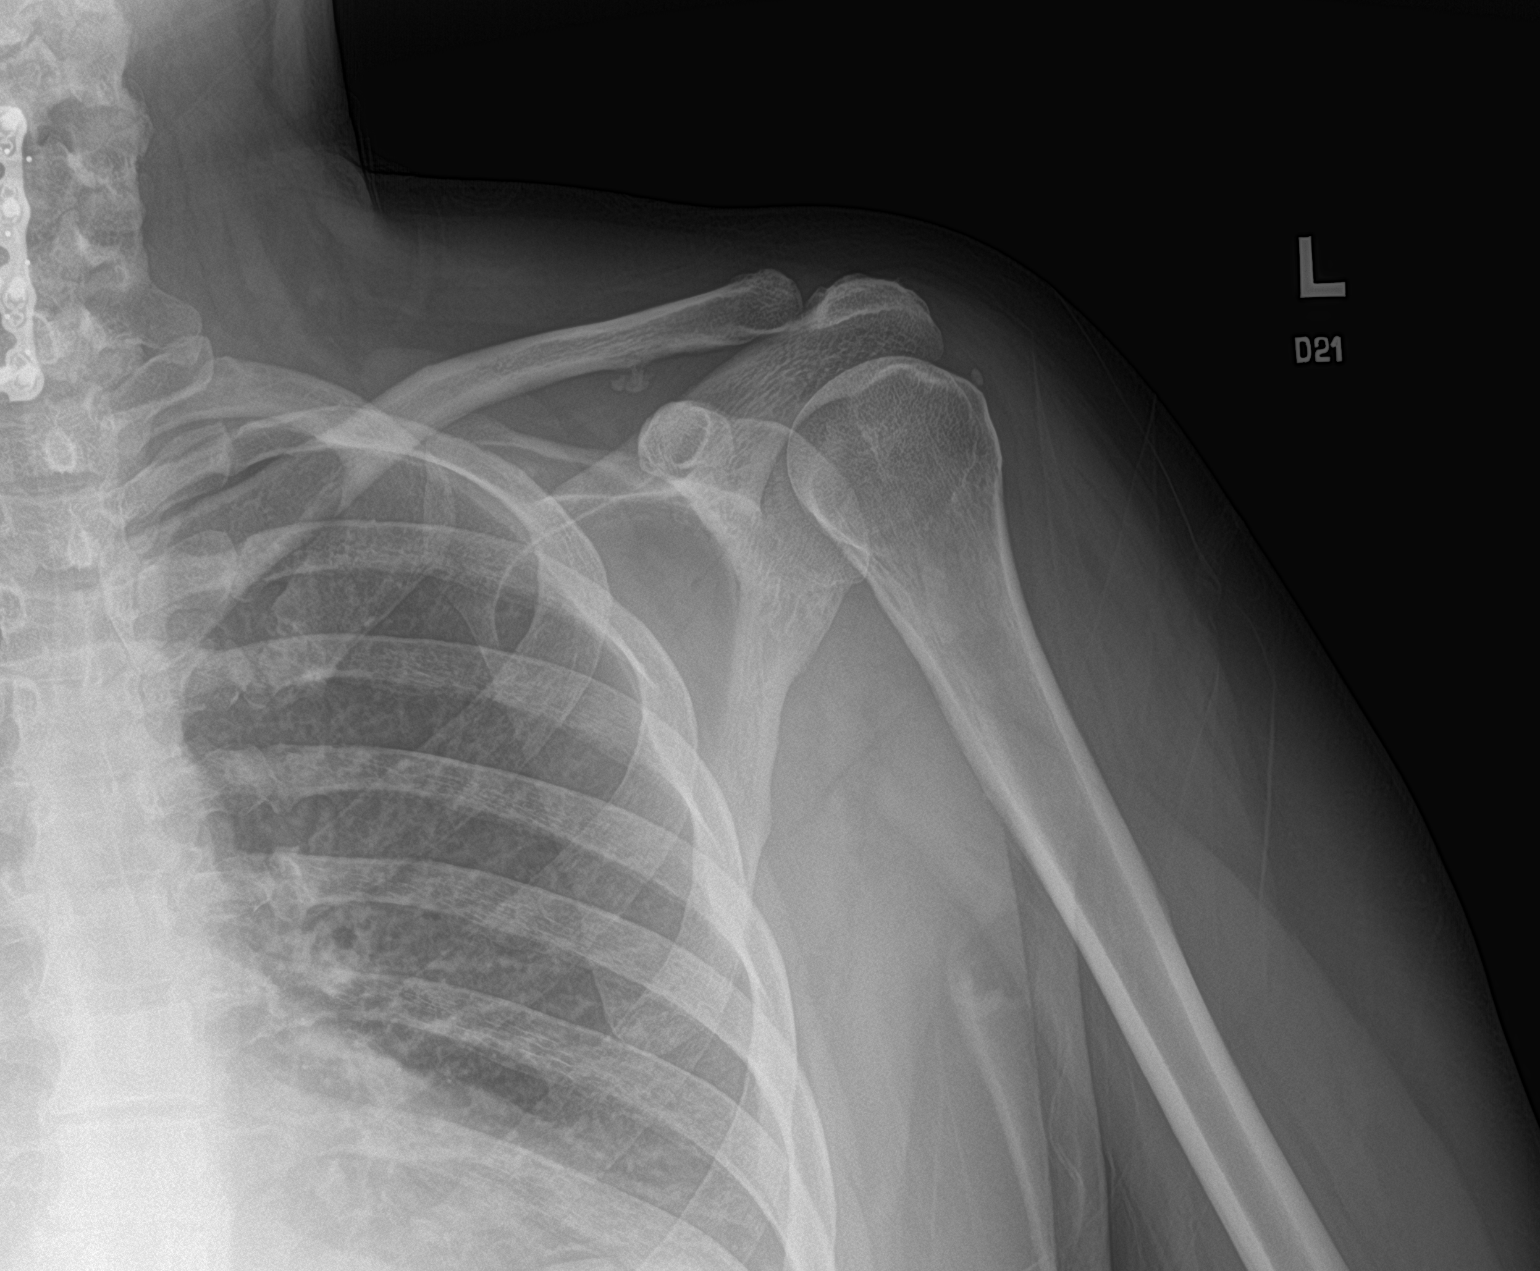

[3 of 3 positions shown; findings below may reference images not displayed]

FINDINGS: Mild calcification along supraspinatus tendon is noted. No acute
fracture or dislocation is seen. Underlying bony thorax is within
normal limits.
IMPRESSION: Mild degenerative change without acute abnormality.
